# Patient Record
Sex: Female | Born: 1960 | Race: Black or African American | Hispanic: No | Smoking: Never smoker
Health system: Southern US, Community
[De-identification: ages and names within clinical notes are randomized; demographics above are authoritative.]

## PROBLEM LIST (undated history)

## (undated) DIAGNOSIS — M1990 Unspecified osteoarthritis, unspecified site: Secondary | ICD-10-CM

## (undated) DIAGNOSIS — M545 Low back pain, unspecified: Secondary | ICD-10-CM

## (undated) DIAGNOSIS — E785 Hyperlipidemia, unspecified: Secondary | ICD-10-CM

## (undated) DIAGNOSIS — I10 Essential (primary) hypertension: Secondary | ICD-10-CM

## (undated) HISTORY — DX: Essential (primary) hypertension: I10

## (undated) HISTORY — DX: Hyperlipidemia, unspecified: E78.5

## (undated) HISTORY — DX: Low back pain: M54.5

## (undated) HISTORY — DX: Unspecified osteoarthritis, unspecified site: M19.90

## (undated) HISTORY — DX: Low back pain, unspecified: M54.50

---

## 2001-07-16 ENCOUNTER — Other Ambulatory Visit: Admission: RE | Admit: 2001-07-16 | Discharge: 2001-07-16 | Payer: Self-pay | Admitting: Obstetrics and Gynecology

## 2008-01-18 LAB — CONVERTED CEMR LAB: Pap Smear: NORMAL

## 2008-07-25 ENCOUNTER — Encounter: Admission: RE | Admit: 2008-07-25 | Discharge: 2008-07-25 | Payer: Self-pay | Admitting: Emergency Medicine

## 2009-01-09 ENCOUNTER — Emergency Department (HOSPITAL_COMMUNITY): Admission: EM | Admit: 2009-01-09 | Discharge: 2009-01-09 | Payer: Self-pay | Admitting: Emergency Medicine

## 2009-03-09 ENCOUNTER — Ambulatory Visit: Payer: Self-pay | Admitting: Internal Medicine

## 2009-03-09 DIAGNOSIS — I10 Essential (primary) hypertension: Secondary | ICD-10-CM | POA: Insufficient documentation

## 2009-03-09 DIAGNOSIS — M1990 Unspecified osteoarthritis, unspecified site: Secondary | ICD-10-CM | POA: Insufficient documentation

## 2009-03-09 DIAGNOSIS — M129 Arthropathy, unspecified: Secondary | ICD-10-CM | POA: Insufficient documentation

## 2009-03-09 DIAGNOSIS — J45909 Unspecified asthma, uncomplicated: Secondary | ICD-10-CM | POA: Insufficient documentation

## 2009-03-09 DIAGNOSIS — E785 Hyperlipidemia, unspecified: Secondary | ICD-10-CM | POA: Insufficient documentation

## 2009-03-23 ENCOUNTER — Encounter: Payer: Self-pay | Admitting: Internal Medicine

## 2009-03-28 ENCOUNTER — Encounter: Payer: Self-pay | Admitting: Internal Medicine

## 2009-04-07 ENCOUNTER — Ambulatory Visit: Payer: Self-pay | Admitting: Internal Medicine

## 2009-04-07 DIAGNOSIS — E559 Vitamin D deficiency, unspecified: Secondary | ICD-10-CM | POA: Insufficient documentation

## 2009-04-20 ENCOUNTER — Encounter: Payer: Self-pay | Admitting: Internal Medicine

## 2009-05-02 ENCOUNTER — Ambulatory Visit: Payer: Self-pay | Admitting: Gastroenterology

## 2009-05-03 ENCOUNTER — Telehealth: Payer: Self-pay | Admitting: Internal Medicine

## 2009-06-07 ENCOUNTER — Ambulatory Visit: Payer: Self-pay | Admitting: Internal Medicine

## 2010-09-12 ENCOUNTER — Ambulatory Visit: Payer: Self-pay | Admitting: Internal Medicine

## 2010-09-12 DIAGNOSIS — J309 Allergic rhinitis, unspecified: Secondary | ICD-10-CM | POA: Insufficient documentation

## 2010-09-13 ENCOUNTER — Encounter: Payer: Self-pay | Admitting: Internal Medicine

## 2010-09-13 LAB — CONVERTED CEMR LAB
Albumin: 4.6 g/dL
Alkaline Phosphatase: 64 units/L
Basophils Relative: 1 %
CO2: 26 meq/L
Creatinine, Ser: 0.98 mg/dL
Eosinophils Relative: 6 %
HCT: 38.4 %
HDL: 90 mg/dL
LDL (calc): 147 mg/dL
Neutrophils Relative %: 52 %
RBC: 4.53 M/uL
Total Bilirubin: 0.5 mg/dL
Triglyceride fasting, serum: 61 mg/dL

## 2010-09-14 ENCOUNTER — Encounter: Payer: Self-pay | Admitting: Internal Medicine

## 2010-09-28 ENCOUNTER — Telehealth: Payer: Self-pay | Admitting: Internal Medicine

## 2010-10-02 ENCOUNTER — Telehealth (INDEPENDENT_AMBULATORY_CARE_PROVIDER_SITE_OTHER): Payer: Self-pay | Admitting: *Deleted

## 2010-11-14 ENCOUNTER — Encounter: Payer: Self-pay | Admitting: Internal Medicine

## 2010-11-14 ENCOUNTER — Ambulatory Visit: Payer: Self-pay | Admitting: Internal Medicine

## 2010-11-16 ENCOUNTER — Encounter: Payer: Self-pay | Admitting: Internal Medicine

## 2010-12-17 LAB — CONVERTED CEMR LAB
Bilirubin, Direct: 0.11 mg/dL
Pap Smear: NORMAL
TSH: 0.934 microintl units/mL

## 2010-12-19 NOTE — Letter (Signed)
Summary: Lipid Letter  [facility]  [address] [place], [place] [address]   Phone: [phone]  Fax: [phone]    09/14/2010  [facility] [address] [place], [place]  [address]  Dear Gabrielle Long:  We have carefully reviewed your last lipid profile from  and the results are noted below with a summary of recommendations for lipid management.    Cholesterol:       249     Goal: <200   HDL "good" Cholesterol:   90     Goal: >50 very good   LDL "bad" Cholesterol:   147     Goal: <130   Triglycerides:       61     Goal: <150        TLC Diet (Therapeutic Lifestyle Change): Saturated Fats & Transfatty acids should be kept < 7% of total calories ***Reduce Saturated Fats Polyunstaurated Fat can be up to 10% of total calories Monounsaturated Fat Fat can be up to 20% of total calories Total Fat should be no greater than 25-35% of total calories Carbohydrates should be 50-60% of total calories Protein should be approximately 15% of total calories Fiber should be at least 20-30 grams a day ***Increased fiber may help lower LDL Total Cholesterol should be < 200mg /day Consider adding plant stanol/sterols to diet (example: Benacol spread) ***A higher intake of unsaturated fat may reduce Triglycerides and Increase HDL    Adjunctive Measures (may lower LIPIDS and reduce risk of Heart Attack) include: Aerobic Exercise (20-30 minutes 3-4 times a week) Limit Alcohol Consumption Weight Reduction Aspirin 75-81 mg a day by mouth (if not allergic or contraindicated) Dietary Fiber 20-30 grams a day by mouth     Current Medications: 1)    Vitamin D 6000 Unit Tabs (cholecalciferol)  .... Take 1 tablet by mouth once a day 2)    Vitamin E  .... Take 1 tablet by mouth three times a day 3)    Nasacort Aq 55 Mcg/act Aers (Triamcinolone acetonide) .... 2 puffs each nostril once daily  If you have any questions, please call. We appreciate being able to work with you.   Sincerely,      [facility] Etta Grandchild MD

## 2010-12-19 NOTE — Miscellaneous (Signed)
Summary: 09/12/10 [facility] Results  Clinical Lists Changes  Observations: Added new observation of CBC COMMENTS: MCH 29.4, MCHC 34.6, Absolute Neutrophils 2.8, Absolute Monocytes 0.4, Absolute Eos 0.3, Absolute Baso 1, Absolute Lymphs 1.8 (09/13/2010 16:06) Added new observation of TRIGLYCERIDE: 61 mg/dL (53/66/4403 47:42) Added new observation of HDL: 90 mg/dL (59/56/3875 64:33) Added new observation of LDL (CALCUL): 147 mg/dL (29/51/8841 66:06) Added new observation of CHOLESTEROL: 249 mg/dL (30/16/0109 32:35) Added new observation of GFRAA: 79 mL/min (09/13/2010 16:06) Added new observation of GFR: 68 mL/min (09/13/2010 16:06) Added new observation of ALBUMIN: 4.6 g/dL (57/32/2025 42:70) Added new observation of PROTEIN, TOT: 7.3 g/dL (62/37/6283 15:17) Added new observation of CALCIUM: 9.6 mg/dL (61/60/7371 06:26) Added new observation of ALK PHOS: 64 units/L (09/13/2010 16:06) Added new observation of BILI TOTAL: 0.5 mg/dL (94/85/4627 03:50) Added new observation of SGPT (ALT): 19 units/L (09/13/2010 16:06) Added new observation of SGOT (AST): 18 units/L (09/13/2010 16:06) Added new observation of CO2 PLSM/SER: 26 meq/L (09/13/2010 16:06) Added new observation of CL SERUM: 102 meq/L (09/13/2010 16:06) Added new observation of K SERUM: 3.8 meq/L (09/13/2010 16:06) Added new observation of NA: 142 meq/L (09/13/2010 16:06) Added new observation of CREATININE: 0.98 mg/dL (09/38/1829 93:71) Added new observation of BUN: 13 mg/dL (69/67/8938 10:17) Added new observation of BG RANDOM: 83 mg/dL (51/12/5850 77:82) Added new observation of BASOPHIL %: 1 % (09/13/2010 16:06) Added new observation of % EOS AUTO: 6 % (09/13/2010 16:06) Added new observation of MONOCYTE %: 7 % (09/13/2010 16:06) Added new observation of LYMPH %: 34 % (09/13/2010 16:06) Added new observation of PMN %: 52 % (09/13/2010 16:06) Added new observation of RDW: 13.2 % (09/13/2010 16:06) Added new observation of MCV: 85  fL (09/13/2010 16:06) Added new observation of PLATELETK/UL: 297 K/uL (09/13/2010 16:06) Added new observation of RBC M/UL: 4.53 M/uL (09/13/2010 16:06) Added new observation of HCT: 38.4 % (09/13/2010 16:06) Added new observation of WBC COUNT: 5.4 10*3/microliter (09/13/2010 16:06) Added new observation of VIT D 25-OH: 46.8 ng/mL (09/12/2010 16:35) Added new observation of BILI DIRECT: 0.11 mg/dL (42/35/3614 43:15) Added new observation of TSH: 0.934 microintl units/mL (09/12/2010 16:33) Added new observation of LIPIDMEDPRES: VLDL Cal 12,  (09/12/2010 16:31)      -  Date:  09/13/2010    WBC: 5.4    HCT: 38.4    RBC: 4.53    PLT: 297    MCV: 85    RDW: 13.2    Neutrophil: 52    Lymphs: 34    Monos: 7    Eos: 6    Basophil: 1    BG Random: 83    BUN: 13    Creatinine: 0.98    Sodium: 142    Potassium: 3.8    Chloride: 102    CO2 Total: 26    SGOT (AST): 18    SGPT (ALT): 19    T. Bilirubin: 0.5    Alk Phos: 64    Calcium: 9.6    Total Protein: 7.3    Albumin: 4.6    GFR(Non African American): 68    GFR(African American) 79    Cholesterol: 249    LDL-calculated: 147    HDL: 90    Triglycerides: 61    CBC Comments: MCH 29.4, MCHC 34.6, Absolute Neutrophils 2.8, Absolute Monocytes 0.4, Absolute Eos 0.3, Absolute Baso 1, Absolute Lymphs 1.8  Date:  09/12/2010    Current Lipid Medications VLDL Cal 12,    TSH: 0.934    Direct Bili: 0.11  Vit D 25: 46.8

## 2010-12-19 NOTE — Letter (Signed)
Summary: Results Follow-up Letter  [facility]  [address] [place], [place] [address]   Phone: [phone]  Fax: [phone]    09/14/2010  [address] [place], [place]  [address]  Dear Ms. Cadman,   The following are the results of your recent test(s):  Test     Result     Vitamin D     normal CBC       normal Liver/kidney   normal Thyroid     normal   _________________________________________________________  Please call for an appointment as needed _________________________________________________________ _________________________________________________________ _________________________________________________________  Sincerely,  Sanda Linger MD [facility]

## 2010-12-19 NOTE — Progress Notes (Signed)
Summary: PA-Benicar  Phone Note From Pharmacy   Summary of Call: PA-Benicar, called [facility] @ [phone], awaiting form. Dagoberto Reef  October 02, 2010 4:31 PM Form to Dr Yetta Barre to be filled out . Dagoberto Reef  October 03, 2010 3:42 PM  Faxed to [facility] @ [phone], awaiting approval. Dagoberto Reef  October 04, 2010 8:18 AM  Insurance denied, has pt tried one Ace inhibitor or an Ace inhibitor combination product or generic losartan or generic losartan combination product. Dagoberto Reef  October 04, 2010 3:26 PM     New/Updated Medications: LOSARTAN POTASSIUM 50 MG TABS (LOSARTAN POTASSIUM) One by mouth once daily for high blood pressure Prescriptions: LOSARTAN POTASSIUM 50 MG TABS (LOSARTAN POTASSIUM) One by mouth once daily for high blood pressure  #30 x 11   Entered and Authorized by:   Etta Grandchild MD   Signed by:   Etta Grandchild MD on 10/04/2010   Method used:   Electronically to        [id number] (retail)       [address]       [place], [place]  [address]       Ph: [phone] or [phone]       Fax: [phone]   RxID:   [id number]

## 2010-12-19 NOTE — Progress Notes (Signed)
Summary: ELEVATED Bp   Phone Note Call from Patient Call back at [phone]   Summary of Call: Pt c/o elevated bp 145/107 today. Does pt need office visit or would MD start her back on BP med?  Initial call taken by: Lamar Sprinkles, CMA,  September 28, 2010 11:43 AM  Follow-up for Phone Call        can restart benicar Follow-up by: Etta Grandchild MD,  September 28, 2010 11:44 AM  Additional Follow-up for Phone Call Additional follow up Details #1::        Pt informed, She will continue to monitor BP and call office w/any further concerns.  Additional Follow-up by: Lamar Sprinkles, CMA,  September 28, 2010 2:07 PM    New/Updated Medications: BENICAR 40 MG TABS (OLMESARTAN MEDOXOMIL) One by mouth once daily for high blood pressure Prescriptions: BENICAR 40 MG TABS (OLMESARTAN MEDOXOMIL) One by mouth once daily for high blood pressure  #30 x 11   Entered and Authorized by:   Etta Grandchild MD   Signed by:   Etta Grandchild MD on 09/28/2010   Method used:   Electronically to        [facility] (retail)       [address]       [place], [place]  [address]       Ph: [phone]       Fax: [phone]   RxID:   [id number]

## 2010-12-19 NOTE — Assessment & Plan Note (Signed)
Summary: CPX / NWS /#   Vital Signs:  Patient profile:   50 year old female Menstrual status:  perimenopausal LMP:     08/22/2010 Height:      65 inches Weight:      149 pounds BMI:     24.88 O2 Sat:      97 % on Room air Temp:     98.2 degrees F oral Pulse rate:   83 / minute Pulse rhythm:   regular Resp:     16 per minute BP sitting:   126 / 82  (left arm) Cuff size:   large  Vitals Entered By: Rock Nephew CMA (September 12, 2010 9:10 AM)  O2 Flow:  Room air CC: CPX w lab order , Preventive Care, URI symptoms, Hypertension Management Is Patient Diabetic? No Pain Assessment Patient in pain? no       Does patient need assistance? Functional Status Self care Ambulation Normal LMP (date): 08/22/2010     Menstrual Status perimenopausal Enter LMP: 08/22/2010 Last PAP Result normal   Primary Care Provider:  Etta Grandchild MD  CC:  CPX w lab order , Preventive Care, URI symptoms, and Hypertension Management.  History of Present Illness:  URI Symptoms      This is a 50 year old woman who presents with URI symptoms.  The symptoms began 3 days ago.  The patient reports nasal congestion and clear nasal discharge, but denies purulent nasal discharge, sore throat, dry cough, productive cough, earache, and sick contacts.  The patient denies fever, stiff neck, dyspnea, wheezing, rash, vomiting, diarrhea, use of an antipyretic, and response to antipyretic.  The patient also reports itchy throat, sneezing, seasonal symptoms, and response to antihistamine.  The patient denies headache, muscle aches, and severe fatigue.  The patient denies the following risk factors for Strep sinusitis: unilateral facial pain, unilateral nasal discharge, poor response to decongestant, double sickening, tooth pain, Strep exposure, tender adenopathy, and absence of cough.    Hypertension History:      She denies headache, chest pain, palpitations, dyspnea with exertion, orthopnea, PND, peripheral  edema, visual symptoms, neurologic problems, syncope, and side effects from treatment.        Positive major cardiovascular risk factors include hyperlipidemia and hypertension.  Negative major cardiovascular risk factors include female age less than 89 years old, no history of diabetes, negative family history for ischemic heart disease, and non-tobacco-user status.        Further assessment for target organ damage reveals no history of ASHD, cardiac end-organ damage (CHF/LVH), stroke/TIA, peripheral vascular disease, renal insufficiency, or hypertensive retinopathy.     Preventive Screening-Counseling & Management  Alcohol-Tobacco     Alcohol drinks/day: 0     Alcohol Counseling: not indicated; patient does not drink     Smoking Status: never     Tobacco Counseling: not indicated; no tobacco use  Hep-HIV-STD-Contraception     Hepatitis Risk: no risk noted     HIV Risk: no risk noted     STD Risk: no risk noted     Dental Visit-last 6 months yes     Dental Care Counseling: to seek dental care; no dental care within six months     SBE monthly: yes     SBE Education/Counseling: to perform regular SBE     Sun Exposure-Excessive: no  Safety-Violence-Falls     Seat Belt Use: yes     Helmet Use: n/a     Firearms in the Home: no firearms  in the home     Smoke Detectors: yes     Violence in the Home: no risk noted     Sexual Abuse: no      Sexual History:  currently monogamous.        Drug Use:  no.        Blood Transfusions:  no.    Medications Prior to Update: 1)  Vitamin D 6000 Unit Tabs (Cholecalciferol)  Current Medications (verified): 1)  Vitamin D 6000 Unit Tabs (Cholecalciferol) .... Take 1 Tablet By Mouth Once A Day 2)  Vitamin E .... Take 1 Tablet By Mouth Three Times A Day 3)  Nasacort Aq 55 Mcg/act Aers (Triamcinolone Acetonide) .... 2 Puffs Each Nostril Once Daily  Allergies (verified): No Known Drug Allergies  Past History:  Past Medical History: Last  updated: 05/02/2009 Asthma Hyperlipidemia Hypertension Low back pain  Degenerative joint disease  Past Surgical History: Last updated: 03/09/2009 Denies surgical history  Family History: Last updated: 05/02/2009 Family History of Colon CA: MGM at 54. 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 Family History of Colon CA: MGM at 78. Family History Hypertension (parent & grandparents) Family History Kidney disease (Grandparents) Family History Lung cancer (grandparents)  Social History: Reviewed history from 03/09/2009 and no changes required. 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    Physical Exam  General:  Well developed, well nourished, no acute distress. Head:  normocephalic, atraumatic, no abnormalities observed, and no abnormalities palpated.   Ears:  R ear normal and L ear normal.   Nose:  no nasal discharge, mucosal pallor and mucosal edema. no airflow obstruction, no nasal polyps, no nasal mucosal lesions, no mucosal  friability, no active bleeding or clots, no sinus percussion tenderness, no septum abnormalities.   Mouth:  Oral mucosa and oropharynx without lesions or exudates.  Teeth in good repair. Neck:  supple, full ROM, no masses, no thyromegaly, no JVD, normal carotid upstroke, no carotid bruits, no cervical lymphadenopathy, and no neck tenderness.   Lungs:  normal respiratory effort, no intercostal retractions, no accessory muscle use, normal breath sounds, no dullness, no fremitus, no crackles, and no wheezes.   Heart:  normal rate, regular rhythm, no murmur, no gallop, no rub, and no JVD.   Abdomen:  soft, non-tender, normal bowel sounds, no distention, no masses, no guarding, no rigidity, no rebound tenderness, no abdominal hernia, no inguinal hernia, no hepatomegaly, and no splenomegaly.   Msk:  No deformity or scoliosis noted of thoracic or lumbar spine.   Pulses:  R and L carotid,radial,femoral,dorsalis pedis and posterior tibial pulses are full and equal bilaterally Extremities:  No clubbing, cyanosis, edema, or deformity noted with normal full range of motion of all joints.   Neurologic:  No cranial nerve deficits  noted. Station and gait are normal. Plantar reflexes are down-going bilaterally. DTRs are symmetrical throughout. Sensory, motor and coordinative functions appear intact. Skin:  turgor normal, color normal, no rashes, no suspicious lesions, no ecchymoses, no petechiae, no purpura, no ulcerations, and no edema.   Cervical Nodes:  no anterior cervical adenopathy and no posterior cervical adenopathy.   Axillary Nodes:  no R axillary adenopathy and no L axillary adenopathy.   Inguinal Nodes:  no R inguinal adenopathy and no L inguinal adenopathy.   Psych:  Cognition and judgment appear intact. Alert and cooperative with normal attention span and concentration. 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) Assessment New start nasacort  Problem # 2:  VITAMIN D DEFICIENCY (ICD-268.9) Assessment: Unchanged  Orders: Venipuncture (93235) TLB-Lipid Panel (80061-LIPID) TLB-BMP (Basic Metabolic Panel-BMET) (80048-METABOL) TLB-CBC Platelet - w/Differential (85025-CBCD) TLB-Hepatic/Liver Function Pnl (80076-HEPATIC) TLB-TSH (Thyroid Stimulating Hormone) (84443-TSH) T-Vitamin D (25-Hydroxy) (57322-02542)  Problem # 3:  HYPERTENSION (ICD-401.9) Assessment: Improved  Orders: Venipuncture (70623) TLB-Lipid Panel (80061-LIPID) TLB-BMP (Basic Metabolic Panel-BMET) (80048-METABOL) TLB-CBC Platelet - w/Differential (85025-CBCD) TLB-Hepatic/Liver Function Pnl (80076-HEPATIC) TLB-TSH (Thyroid Stimulating Hormone) (84443-TSH) T-Vitamin D (25-Hydroxy) (76283-15176)  BP today: 126/82 Prior BP: 120/82 (06/07/2009)  Prior 10 Yr Risk Heart Disease: Not enough information (03/09/2009)  Problem # 4:  HYPERLIPIDEMIA (ICD-272.4) Assessment: Unchanged  Orders: Venipuncture ([address]) TLB-Lipid Panel (80061-LIPID) TLB-BMP (Basic Metabolic Panel-BMET) (80048-METABOL) TLB-CBC Platelet - w/Differential (85025-CBCD) TLB-Hepatic/Liver Function Pnl (80076-HEPATIC) TLB-TSH (Thyroid Stimulating Hormone) (84443-TSH) T-Vitamin D (25-Hydroxy) (71062-69485)  Prior 10 Yr Risk Heart Disease: Not enough information (03/09/2009)  Complete Medication List: 1)  Vitamin D 6000 Unit Tabs (cholecalciferol)  .... Take 1 tablet by mouth once a day 2)  Vitamin E  .... Take 1 tablet by mouth three times a day 3)  Nasacort Aq 55 Mcg/act Aers (Triamcinolone acetonide) .... 2 puffs each nostril once daily  Hypertension Assessment/Plan:      The patient's hypertensive risk group is category B: At least one risk factor (excluding diabetes) with no target organ damage.  Today's blood pressure is 126/82.  Her blood pressure goal is < 140/90.  PAP Screening:    Hx Cervical Dysplasia in last 5 yrs? No    3  normal PAP smears in last 5 yrs? Yes    Last PAP smear:  11/19/2008    Reviewed PAP smear recommendations:  patient defers to GYN provider  Mammogram Screening:    Last Mammogram:  11/19/2008    Reviewed Mammogram recommendations:  mammogram ordered  Osteoporosis Risk Assessment:  Risk Factors for Fracture or Low Bone Density:   Smoking status:       never  Immunization & Chemoprophylaxis:    Tetanus vaccine: Td  (11/19/2006)  Patient Instructions: 1)  Please schedule a follow-up appointment in 2 months. 2)  It is important that you exercise regularly at least 20 minutes 5 times a week. If you develop chest pain, have severe difficulty breathing, or feel very tired , stop exercising immediately and seek medical attention. 3)  Check your Blood Pressure regularly. If it is above 140/90: you should make an appointment. Prescriptions: NASACORT AQ 55 MCG/ACT AERS (TRIAMCINOLONE ACETONIDE) 2 puffs each nostril once daily  #1 inhaler x 11   Entered and Authorized by:   Etta Grandchild MD   Signed by:   Etta Grandchild MD on 09/12/2010   Method used:   Electronically to  [facility]       [address]       [place], [place]  [address]       Ph: [phone]       Fax: [phone]   RxID:   [id number]    Orders Added: 1)  Venipuncture [84132] 2)  TLB-Lipid Panel [80061-LIPID] 3)  TLB-BMP (Basic Metabolic Panel-BMET) [80048-METABOL] 4)  TLB-CBC Platelet - w/Differential [85025-CBCD] 5)  TLB-Hepatic/Liver Function Pnl [80076-HEPATIC] 6)  TLB-TSH (Thyroid Stimulating Hormone) [84443-TSH] 7)  T-Vitamin D (25-Hydroxy) [44010-27253] 8)  Est. Patient Level IV [66440]    Preventive Care Screening  Mammogram:    Date:  11/19/2008    Results:  normal   Pap Smear:    Date:  11/19/2008    Results:  normal

## 2010-12-21 NOTE — Letter (Signed)
Summary: Lipid Letter  [facility]  [address] [place], [place] [address]   Phone: [phone]  Fax: [phone]    11/16/2010  [facility] [address] [place], [place]  [address]  Dear Gabrielle Long:  We have carefully reviewed your last lipid profile from  and the results are noted below with a summary of recommendations for lipid management.    Cholesterol:       242     Goal: <200   HDL "good" Cholesterol:   90     Goal: >50 excellent!   LDL "bad" Cholesterol:   143     Goal: <130   Triglycerides:       43     Goal: <150        TLC Diet (Therapeutic Lifestyle Change): Saturated Fats & Transfatty acids should be kept < 7% of total calories ***Reduce Saturated Fats Polyunstaurated Fat can be up to 10% of total calories Monounsaturated Fat Fat can be up to 20% of total calories Total Fat should be no greater than 25-35% of total calories Carbohydrates should be 50-60% of total calories Protein should be approximately 15% of total calories Fiber should be at least 20-30 grams a day ***Increased fiber may help lower LDL Total Cholesterol should be < 200mg /day Consider adding plant stanol/sterols to diet (example: Benacol spread) ***A higher intake of unsaturated fat may reduce Triglycerides and Increase HDL    Adjunctive Measures (may lower LIPIDS and reduce risk of Heart Attack) include: Aerobic Exercise (20-30 minutes 3-4 times a week) Limit Alcohol Consumption Weight Reduction Aspirin 75-81 mg a day by mouth (if not allergic or contraindicated) Dietary Fiber 20-30 grams a day by mouth     Current Medications: 1)    Vitamin D 6000 Unit Tabs (cholecalciferol)  .... Take 1 tablet by mouth once a day 2)    Vitamin E  .... Take 1 tablet by mouth three times a day 3)    Nasacort Aq 55 Mcg/act Aers (Triamcinolone acetonide) .... 2 puffs each nostril once daily 4)    Diovan Hct 160-12.5 Mg Tabs (Valsartan-hydrochlorothiazide) .... One by mouth once daily for  high blood pressure  If you have any questions, please call. We appreciate being able to work with you.   Sincerely,    [facility] Etta Grandchild MD

## 2010-12-21 NOTE — Assessment & Plan Note (Signed)
Summary: 2 MO ROV /NWS  #   Vital Signs:  Patient profile:   50 year old female Menstrual status:  perimenopausal Height:      65 inches Weight:      148 pounds BMI:     24.72 O2 Sat:      99 % on Room air Temp:     98.5 degrees F oral Pulse rate:   85 / minute Pulse rhythm:   regular Resp:     16 per minute BP supine:   144 / 98  (right arm) BP sitting:   138 / 98  (left arm) Cuff size:   regular  Vitals Entered By: Bill Salinas CMA (November 14, 2010 8:14 AM)  O2 Flow:  Room air CC: follow-up visit/ ab   Primary Care Provider:  Etta Grandchild MD  CC:  follow-up visit/ ab.  History of Present Illness:  Hypertension Follow-Up      This is a 50 year old woman who presents for Hypertension follow-up.  The patient reports headaches, but denies lightheadedness, urinary frequency, edema, rash, and fatigue.  The patient denies the following associated symptoms: chest pain, chest pressure, exercise intolerance, dyspnea, palpitations, syncope, leg edema, and pedal edema.  Compliance with medications (by patient report) has been near 100%.  The patient reports that dietary compliance has been fair.  The patient reports no exercise.  Adjunctive measures currently used by the patient include salt restriction and relaxation.    Preventive Screening-Counseling & Management  Alcohol-Tobacco     Alcohol drinks/day: 0     Alcohol Counseling: not indicated; patient does not drink     Smoking Status: never     Tobacco Counseling: not indicated; no tobacco use  Hep-HIV-STD-Contraception     Hepatitis Risk: no risk noted     HIV Risk: no risk noted     STD Risk: no risk noted     Dental Visit-last 6 months yes     Dental Care Counseling: to seek dental care; no dental care within six months     SBE monthly: yes     SBE Education/Counseling: to perform regular SBE     Sun Exposure-Excessive: no      Sexual History:  currently monogamous.        Drug Use:  no.        Blood  Transfusions:  no.    Clinical Review Panels:  Prevention   Last Mammogram:  normal (11/19/2008)   Last Pap Smear:  normal (11/19/2008)  Immunizations   Last Tetanus Booster:  Td (11/19/2006)  Lipid Management   Cholesterol:  249 (09/13/2010)   HDL (good cholesterol):  90 (09/13/2010)   Triglycerides:  61 (09/13/2010)  Diabetes Management   Creatinine:  0.98 (09/13/2010)  CBC   WBC:  5.4 (09/13/2010)   RBC:  4.53 (09/13/2010)   Hct:  38.4 (09/13/2010)   Platelets:  297 (09/13/2010)   MCV  85 (09/13/2010)   RDW  13.2 (09/13/2010)   PMN:  52 (09/13/2010)   Monos:  7 (09/13/2010)   Eosinophils:  6 (09/13/2010)   Basophil:  1 (09/13/2010)  Complete Metabolic Panel   Glucose:  83 (09/13/2010)   Sodium:  142 (09/13/2010)   Potassium:  3.8 (09/13/2010)   Chloride:  102 (09/13/2010)   CO2:  26 (09/13/2010)   BUN:  13 (09/13/2010)   Creatinine:  0.98 (09/13/2010)   Albumin:  4.6 (09/13/2010)   Total Protein:  7.3 (09/13/2010)   Calcium:  9.6 (  09/13/2010)   Total Bili:  0.5 (09/13/2010)   Alk Phos:  64 (09/13/2010)   SGPT (ALT):  19 (09/13/2010)   SGOT (AST):  18 (09/13/2010)   Medications Prior to Update: 1)  Vitamin D 6000 Unit Tabs (Cholecalciferol) .... Take 1 Tablet By Mouth Once A Day 2)  Vitamin E .... Take 1 Tablet By Mouth Three Times A Day 3)  Nasacort Aq 55 Mcg/act Aers (Triamcinolone Acetonide) .... 2 Puffs Each Nostril Once Daily 4)  Losartan Potassium 50 Mg Tabs (Losartan Potassium) .... One By Mouth Once Daily For High Blood Pressure  Current Medications (verified): 1)  Vitamin D 6000 Unit Tabs (Cholecalciferol) .... Take 1 Tablet By Mouth Once A Day 2)  Vitamin E .... Take 1 Tablet By Mouth Three Times A Day 3)  Nasacort Aq 55 Mcg/act Aers (Triamcinolone Acetonide) .... 2 Puffs Each Nostril Once Daily 4)  Diovan Hct 160-12.5 Mg Tabs (Valsartan-Hydrochlorothiazide) .... One By Mouth Once Daily For High Blood Pressure  Allergies (verified): No Known  Drug Allergies  Past History:  Past Medical History: Last updated: 05/02/2009 Asthma Hyperlipidemia Hypertension Low back pain  Degenerative joint disease  Past Surgical History: Last updated: 03/09/2009 Denies surgical history  Family History: Last updated: 05/02/2009 Family History of Colon CA: MGM at 53. 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 Family History of Colon CA: MGM at 67. Family History Hypertension (parent & grandparents) Family History Kidney disease (Grandparents) Family History Lung cancer (grandparents)  Social History: Reviewed history from 03/09/2009 and no changes required. 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  The patient denies anorexia, fever, weight loss, weight gain, chest pain, syncope, dyspnea on exertion, peripheral edema, prolonged cough, hemoptysis, abdominal pain, muscle weakness, suspicious skin lesions, transient blindness, difficulty walking, and depression.    Physical Exam  General:  Well developed, well nourished, no acute distress. Head:  normocephalic, atraumatic, no abnormalities observed, and no abnormalities palpated.   Mouth:  Oral mucosa and oropharynx without lesions or exudates.  Teeth in good repair. Neck:  supple, full ROM, no masses, no thyromegaly, no JVD, normal carotid upstroke, no carotid bruits, no cervical lymphadenopathy, and no  neck tenderness.   Lungs:  normal respiratory effort, no intercostal retractions, no accessory muscle use, normal breath sounds, no dullness, no fremitus, no crackles, and no wheezes.   Heart:  normal rate, regular rhythm, no murmur, no gallop, no rub, and no JVD.   Abdomen:  soft, non-tender, normal bowel sounds, no distention, no masses, no guarding, no rigidity, no rebound tenderness, no abdominal hernia, no inguinal hernia, no hepatomegaly, and no splenomegaly.   Msk:  No deformity or scoliosis noted of thoracic or lumbar spine.   Pulses:  R and L carotid,radial,femoral,dorsalis pedis and posterior tibial pulses are full and equal bilaterally Extremities:  No clubbing, cyanosis, edema, or deformity noted with normal full range of motion of all joints.   Neurologic:  No cranial nerve deficits noted. Station and gait are normal. Plantar reflexes are down-going bilaterally. DTRs are symmetrical throughout. Sensory,  motor and coordinative functions appear intact. Skin:  turgor normal, color normal, no rashes, no suspicious lesions, no ecchymoses, no petechiae, no purpura, no ulcerations, and no edema.   Cervical Nodes:  no anterior cervical adenopathy and no posterior cervical adenopathy.   Psych:  Cognition and judgment appear intact. Alert and cooperative with normal attention span and concentration. No apparent delusions, illusions, hallucinations   Impression & Recommendations:  Problem # 1:  VITAMIN D DEFICIENCY (ICD-268.9) Assessment Improved  Problem # 2:  HYPERTENSION (ICD-401.9) Assessment: Deteriorated  The following medications were removed from the medication list:    Losartan Potassium 50 Mg Tabs (Losartan potassium) ..... One by mouth once daily for high blood pressure Her updated medication list for this problem includes:    Diovan Hct 160-12.5 Mg Tabs (Valsartan-hydrochlorothiazide) ..... One by mouth once daily for high blood pressure  Orders: TLB-BMP (Basic Metabolic  Panel-BMET) (80048-METABOL)  BP today: 138/98 Prior BP: 126/82 (09/12/2010)  Prior 10 Yr Risk Heart Disease: Not enough information (03/09/2009)  Labs Reviewed: K+: 3.8 (09/13/2010) Creat: : 0.98 (09/13/2010)   Chol: 249 (09/13/2010)   HDL: 90 (09/13/2010)   LDL: 147 (09/13/2010)   TG: 61 (09/13/2010)  Problem # 3:  HYPERLIPIDEMIA (ICD-272.4) Assessment: Unchanged  Orders: TLB-Lipid Panel (80061-LIPID)  Labs Reviewed: SGOT: 18 (09/13/2010)   SGPT: 19 (09/13/2010)  Prior 10 Yr Risk Heart Disease: Not enough information (03/09/2009)   HDL:90 (09/13/2010)  LDL:147 (09/13/2010)  Chol:249 (09/13/2010)  Trig:61 (09/13/2010)  Complete Medication List: 1)  Vitamin D 6000 Unit Tabs (cholecalciferol)  .... Take 1 tablet by mouth once a day 2)  Vitamin E  .... Take 1 tablet by mouth three times a day 3)  Nasacort Aq 55 Mcg/act Aers (Triamcinolone acetonide) .... 2 puffs each nostril once daily 4)  Diovan Hct 160-12.5 Mg Tabs (Valsartan-hydrochlorothiazide) .... One by mouth once daily for high blood pressure  Patient Instructions: 1)  Please schedule a follow-up appointment in 2 months. 2)  It is important that you exercise regularly at least 20 minutes 5 times a week. If you develop chest pain, have severe difficulty breathing, or feel very tired , stop exercising immediately and seek medical attention. 3)  Check your Blood Pressure regularly. If it is above 130/80: you should make an appointment. Prescriptions: DIOVAN HCT 160-12.5 MG TABS (VALSARTAN-HYDROCHLOROTHIAZIDE) One by mouth once daily for high blood pressure  #140 x 0   Entered and Authorized by:   Etta Grandchild MD   Signed by:   Etta Grandchild MD on 11/14/2010   Method used:   Samples Given   RxID:   [id number]    Orders Added: 1)  TLB-Lipid Panel [80061-LIPID] 2)  TLB-BMP (Basic Metabolic Panel-BMET) [80048-METABOL] 3)  Est. Patient Level IV [42595]

## 2011-01-16 ENCOUNTER — Ambulatory Visit: Payer: Self-pay | Admitting: Internal Medicine

## 2011-01-23 ENCOUNTER — Ambulatory Visit (INDEPENDENT_AMBULATORY_CARE_PROVIDER_SITE_OTHER): Payer: 59 | Admitting: Internal Medicine

## 2011-01-23 ENCOUNTER — Encounter: Payer: Self-pay | Admitting: Internal Medicine

## 2011-01-23 DIAGNOSIS — I10 Essential (primary) hypertension: Secondary | ICD-10-CM

## 2011-01-23 DIAGNOSIS — E785 Hyperlipidemia, unspecified: Secondary | ICD-10-CM

## 2011-01-30 LAB — HM PAP SMEAR: HM Pap smear: NORMAL

## 2011-01-30 NOTE — Assessment & Plan Note (Signed)
Summary: 2 MO FU /NWS   Vital Signs:  Patient profile:   50 year old female Menstrual status:  perimenopausal Height:      65 inches (165.10 cm) Weight:      148 pounds (67.27 kg) BMI:     24.72 O2 Sat:      98 % on Room air Temp:     98.5 degrees F (36.94 degrees C) oral Pulse rate:   95 / minute Pulse rhythm:   regular Resp:     14 per minute BP sitting:   116 / 82  (left arm) Cuff size:   regular  Vitals Entered By: Burnard Leigh CMA(AAMA) (January 23, 2011 8:14 AM)  O2 Flow:  Room air CC: F/U..sls,cma Is Patient Diabetic? No Pain Assessment Patient in pain? no        Primary Care Provider:  Etta Grandchild MD  CC:  F/U..sls and cma.  History of Present Illness:  Follow-Up Visit      This is a 50 year old woman who presents for Follow-up visit.  The patient denies chest pain, palpitations, dizziness, syncope, edema, SOB, DOE, PND, and orthopnea.  Since the last visit the patient notes no new problems or concerns.  The patient reports taking meds as prescribed, monitoring BP, and dietary compliance.  When questioned about possible medication side effects, the patient notes none.    Preventive Screening-Counseling & Management  Alcohol-Tobacco     Alcohol drinks/day: 0     Alcohol Counseling: not indicated; patient does not drink     Smoking Status: never     Tobacco Counseling: not indicated; no tobacco use  Hep-HIV-STD-Contraception     Hepatitis Risk: no risk noted     HIV Risk: no risk noted     STD Risk: no risk noted     Dental Visit-last 6 months yes     Dental Care Counseling: to seek dental care; no dental care within six months     SBE monthly: yes     SBE Education/Counseling: to perform regular SBE     Sun Exposure-Excessive: no  Clinical Review Panels:  Prevention   Last Mammogram:  normal (11/19/2008)   Last Pap Smear:  normal (11/19/2008)  Immunizations   Last Tetanus Booster:  Td (11/19/2006)  Lipid Management   Cholesterol:   249 (09/13/2010)   HDL (good cholesterol):  90 (09/13/2010)   Triglycerides:  61 (09/13/2010)  Diabetes Management   Creatinine:  0.98 (09/13/2010)  CBC   WBC:  5.4 (09/13/2010)   RBC:  4.53 (09/13/2010)   Hct:  38.4 (09/13/2010)   Platelets:  297 (09/13/2010)   MCV  85 (09/13/2010)   RDW  13.2 (09/13/2010)   PMN:  52 (09/13/2010)   Monos:  7 (09/13/2010)   Eosinophils:  6 (09/13/2010)   Basophil:  1 (09/13/2010)  Complete Metabolic Panel   Glucose:  83 (09/13/2010)   Sodium:  142 (09/13/2010)   Potassium:  3.8 (09/13/2010)   Chloride:  102 (09/13/2010)   CO2:  26 (09/13/2010)   BUN:  13 (09/13/2010)   Creatinine:  0.98 (09/13/2010)   Albumin:  4.6 (09/13/2010)   Total Protein:  7.3 (09/13/2010)   Calcium:  9.6 (09/13/2010)   Total Bili:  0.5 (09/13/2010)   Alk Phos:  64 (09/13/2010)   SGPT (ALT):  19 (09/13/2010)   SGOT (AST):  18 (09/13/2010)   Medications Prior to Update: 1)  Vitamin D 6000 Unit Tabs (Cholecalciferol) .... Take 1  Tablet By Mouth Once A Day 2)  Vitamin E .... Take 1 Tablet By Mouth Three Times A Day 3)  Nasacort Aq 55 Mcg/act Aers (Triamcinolone Acetonide) .... 2 Puffs Each Nostril Once Daily 4)  Diovan Hct 160-12.5 Mg Tabs (Valsartan-Hydrochlorothiazide) .... One By Mouth Once Daily For High Blood Pressure  Current Medications (verified): 1)  Vitamin D 6000 Unit Tabs (Cholecalciferol) .... Take 1 Tablet By Mouth Once A Day 2)  Vitamin E .... Take 1 Tablet By Mouth Three Times A Day 3)  Nasacort Aq 55 Mcg/act Aers (Triamcinolone Acetonide) .... 2 Puffs Each Nostril Once Daily 4)  Lipotropic Adjunct (Supplement For Cholesterol) .... Once Daily 5)  Losartan Potassium-Hctz 50-12.5 Mg Tabs (Losartan Potassium-Hctz) .... One By Mouth Once Daily For High Blood Pressure  Allergies (verified): No Known Drug Allergies  Past History:  Past Medical History: Last updated: 05/02/2009 Asthma Hyperlipidemia Hypertension Low back pain  Degenerative joint  disease  Past Surgical History: Last updated: 03/09/2009 Denies surgical history  Family History: Last updated: 05/02/2009 Family History of Colon CA: MGM at 28. 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 Family History of Colon CA: MGM at 51. Family History Hypertension (parent & grandparents) Family History Kidney disease (Grandparents) Family History Lung cancer (grandparents)  Social History: Reviewed history from 03/09/2009 and no changes required. 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    Physical Exam  General:  Well developed, well nourished, no acute distress. Head:  normocephalic, atraumatic, no abnormalities observed, and no abnormalities palpated.   Mouth:  Oral mucosa and oropharynx without lesions or exudates.  Teeth in good repair. Neck:  supple, full ROM, no masses, no thyromegaly, no JVD, normal carotid upstroke, no carotid bruits, no cervical lymphadenopathy, and no neck tenderness.   Lungs:  normal respiratory effort, no intercostal retractions, no accessory muscle use, normal breath sounds, no dullness,  no fremitus, no crackles, and no wheezes.   Heart:  normal rate, regular rhythm, no murmur, no gallop, no rub, and no JVD.   Abdomen:  soft, non-tender, normal bowel sounds, no distention, no masses, no guarding, no rigidity, no rebound tenderness, no abdominal hernia, no inguinal hernia, no hepatomegaly, and no splenomegaly.   Msk:  No deformity or scoliosis noted of thoracic or lumbar spine.   Pulses:  R and L carotid,radial,femoral,dorsalis pedis and posterior tibial pulses are full and equal bilaterally Extremities:  No clubbing, cyanosis, edema, or deformity noted with normal full range of motion of all joints.   Neurologic:  No cranial nerve deficits noted. Station and gait are normal. Plantar reflexes are down-going bilaterally. DTRs are symmetrical throughout. Sensory, motor and coordinative functions appear intact. Skin:  turgor normal, color normal, no rashes, no suspicious lesions, no ecchymoses, no petechiae, no purpura, no ulcerations, and no edema.   Cervical Nodes:  no anterior cervical adenopathy and no posterior  cervical adenopathy.   Psych:  Cognition and judgment appear intact. Alert and cooperative with normal attention span and concentration. No apparent delusions, illusions, hallucinations   Impression & Recommendations:  Problem # 1:  HYPERTENSION (ICD-401.9) Assessment Improved  The following medications were removed from the medication list:    Diovan Hct 160-12.5 Mg Tabs (Valsartan-hydrochlorothiazide) ..... One by mouth once daily for high blood pressure Her updated medication list for this problem includes:    Losartan Potassium-hctz 50-12.5 Mg Tabs (Losartan potassium-hctz) ..... One by mouth once daily for high blood pressure  Orders: Venipuncture (03474) TLB-BMP (Basic Metabolic Panel-BMET) (80048-METABOL)  BP today: 116/82 Prior BP: 144/98 (11/14/2010)  Prior 10 Yr Risk Heart Disease: Not enough information (03/09/2009)  Labs Reviewed: K+: 3.8  (09/13/2010) Creat: : 0.98 (09/13/2010)   Chol: 249 (09/13/2010)   HDL: 90 (09/13/2010)   LDL: 147 (09/13/2010)   TG: 61 (09/13/2010)  Problem # 2:  HYPERLIPIDEMIA (ICD-272.4) Assessment: Unchanged  Orders: Venipuncture (25956) TLB-Hepatic/Liver Function Pnl (80076-HEPATIC) TLB-Lipid Panel (80061-LIPID)  Labs Reviewed: SGOT: 18 (09/13/2010)   SGPT: 19 (09/13/2010)  Prior 10 Yr Risk Heart Disease: Not enough information (03/09/2009)   HDL:90 (09/13/2010)  LDL:147 (09/13/2010)  Chol:249 (09/13/2010)  Trig:61 (09/13/2010)  Complete Medication List: 1)  Vitamin D 6000 Unit Tabs (cholecalciferol)  .... Take 1 tablet by mouth once a day 2)  Vitamin E  .... Take 1 tablet by mouth three times a day 3)  Nasacort Aq 55 Mcg/act Aers (Triamcinolone acetonide) .... 2 puffs each nostril once daily 4)  Lipotropic Adjunct (supplement For Cholesterol)  .... Once daily 5)  Losartan Potassium-hctz 50-12.5 Mg Tabs (Losartan potassium-hctz) .... One by mouth once daily for high blood pressure  Patient Instructions: 1)  Please schedule a follow-up appointment in 4 months. 2)  Check your Blood Pressure regularly. If it is above 140/90: you should make an appointment. Prescriptions: LOSARTAN POTASSIUM-HCTZ 50-12.5 MG TABS (LOSARTAN POTASSIUM-HCTZ) One by mouth once daily for high blood pressure  #30 x 11   Entered and Authorized by:   Etta Grandchild MD   Signed by:   Etta Grandchild MD on 01/23/2011   Method used:   Electronically to        [facility]  [address] [id number] (retail)       [address]       [place], [place]  [address]       Ph: [phone] or [phone]       Fax: [phone]   RxID:   [id number]    Orders Added: 1)  Venipuncture [76160] 2)  TLB-BMP (Basic Metabolic Panel-BMET) [80048-METABOL] 3)  Venipuncture [73710] 4)  TLB-Hepatic/Liver Function Pnl [80076-HEPATIC] 5)  TLB-Lipid Panel [80061-LIPID] 6)  Est. Patient Level III [62694]

## 2011-03-06 LAB — COMPREHENSIVE METABOLIC PANEL
ALT: 20 U/L (ref 0–35)
Alkaline Phosphatase: 59 U/L (ref 39–117)
BUN: 7 mg/dL (ref 6–23)
CO2: 30 mEq/L (ref 19–32)
Chloride: 101 mEq/L (ref 96–112)
GFR calc non Af Amer: >60 mL/min (ref >60)
Glucose, Bld: 93 mg/dL (ref 70–99)
Potassium: 3.3 mEq/L — ABNORMAL LOW (ref 3.5–5.1)
Sodium: 139 mEq/L (ref 135–145)
Total Bilirubin: 0.9 mg/dL (ref 0.3–1.2)
Total Protein: 7.3 g/dL (ref 6.0–8.3)

## 2011-03-06 LAB — URINALYSIS, ROUTINE W REFLEX MICROSCOPIC
Bilirubin Urine: NEGATIVE
Hgb urine dipstick: NEGATIVE
Ketones, ur: NEGATIVE mg/dL
Protein, ur: NEGATIVE mg/dL
Urobilinogen, UA: 1 mg/dL (ref 0.0–1.0)

## 2011-03-06 LAB — CBC
MCHC: 34.9 g/dL (ref 30.0–36.0)
MCV: 85.8 fL (ref 78.0–100.0)
RDW: 14.1 % (ref 11.5–15.5)

## 2011-03-06 LAB — DIFFERENTIAL
Basophils Absolute: 0 10*3/uL (ref 0.0–0.1)
Basophils Relative: 0 % (ref 0–1)
Eosinophils Absolute: 0.1 10*3/uL (ref 0.0–0.7)
Monocytes Relative: 6 % (ref 3–12)
Neutrophils Relative %: 64 % (ref 43–77)

## 2011-05-28 ENCOUNTER — Encounter: Payer: Self-pay | Admitting: Internal Medicine

## 2011-05-29 ENCOUNTER — Encounter: Payer: Self-pay | Admitting: Internal Medicine

## 2011-05-29 ENCOUNTER — Ambulatory Visit (INDEPENDENT_AMBULATORY_CARE_PROVIDER_SITE_OTHER): Payer: 59 | Admitting: Internal Medicine

## 2011-05-29 DIAGNOSIS — E785 Hyperlipidemia, unspecified: Secondary | ICD-10-CM

## 2011-05-29 DIAGNOSIS — I10 Essential (primary) hypertension: Secondary | ICD-10-CM

## 2011-05-29 DIAGNOSIS — E559 Vitamin D deficiency, unspecified: Secondary | ICD-10-CM

## 2011-05-29 NOTE — Progress Notes (Signed)
  Subjective:    Patient ID: Gabrielle Long, female    DOB: [birth year], 50 y.o.   MRN: [id number]  Hypertension This is a chronic problem. The current episode started more than 1 year ago. The problem has been gradually improving since onset. The problem is controlled. Pertinent negatives include no anxiety, blurred vision, chest pain, headaches, malaise/fatigue, neck pain, orthopnea, palpitations, peripheral edema, PND, shortness of breath or sweats. Past treatments include angiotensin blockers and diuretics. The current treatment provides moderate improvement. Compliance problems include psychosocial issues, exercise and diet.       Review of Systems  Constitutional: Negative.  Negative for malaise/fatigue.  HENT: Negative.  Negative for neck pain.   Eyes: Negative.  Negative for blurred vision.  Respiratory: Negative.  Negative for shortness of breath.   Cardiovascular: Negative.  Negative for chest pain, palpitations, orthopnea and PND.  Gastrointestinal: Negative.   Genitourinary: Negative.   Musculoskeletal: Negative.   Skin: Negative.   Neurological: Negative.  Negative for headaches.  Hematological: Negative.   Psychiatric/Behavioral: Negative.        Objective:   Physical Exam  Vitals reviewed. Constitutional: She is oriented to person, place, and time. She appears well-developed and well-nourished. No distress.  HENT:  Head: Normocephalic and atraumatic.  Right Ear: External ear normal.  Left Ear: External ear normal.  Nose: Nose normal.  Mouth/Throat: Oropharynx is clear and moist. No oropharyngeal exudate.  Eyes: Conjunctivae and EOM are normal. Pupils are equal, round, and reactive to light. Right eye exhibits no discharge. Left eye exhibits no discharge. No scleral icterus.  Neck: Normal range of motion. Neck supple. No JVD present. No tracheal deviation present. No thyromegaly present.  Cardiovascular: Normal rate, regular rhythm, normal heart sounds and intact  distal pulses.  Exam reveals no gallop and no friction rub.   No murmur heard. Pulmonary/Chest: Effort normal and breath sounds normal. No stridor. No respiratory distress. She has no wheezes. She has no rales. She exhibits no tenderness.  Abdominal: Soft. Bowel sounds are normal. She exhibits no distension and no mass. There is no tenderness. There is no rebound and no guarding.  Musculoskeletal: Normal range of motion. She exhibits no edema and no tenderness.  Lymphadenopathy:    She has no cervical adenopathy.  Neurological: She is alert and oriented to person, place, and time. She has normal reflexes. She displays normal reflexes. No cranial nerve deficit. She exhibits normal muscle tone. Coordination normal.  Skin: Skin is warm and dry. No rash noted. She is not diaphoretic. No erythema. No pallor.  Psychiatric: She has a normal mood and affect. Her behavior is normal. Judgment and thought content normal.        Lab Results  Component Value Date   WBC 5.4 09/13/2010   HGB 14.5 01/09/2009   HCT 38.4 09/13/2010   PLT 297 09/13/2010   CHOL 249 09/13/2010   HDL 90 09/13/2010   ALT 19 09/13/2010   AST 18 09/13/2010   NA 142 09/13/2010   K 3.8 09/13/2010   CL 102 09/13/2010   CREATININE 0.98 09/13/2010   BUN 13 09/13/2010   CO2 26 09/13/2010   TSH 0.934 09/12/2010    Assessment & Plan:

## 2011-05-29 NOTE — Assessment & Plan Note (Signed)
Her BP is reasonably well controlled despite some non-compliance, I have ordered labs today at her request, they will be done at [facility]

## 2011-05-29 NOTE — Patient Instructions (Signed)

## 2011-06-13 ENCOUNTER — Encounter: Payer: Self-pay | Admitting: Internal Medicine

## 2011-07-26 ENCOUNTER — Encounter: Payer: Self-pay | Admitting: Internal Medicine

## 2011-09-26 ENCOUNTER — Ambulatory Visit: Payer: 59 | Admitting: Internal Medicine

## 2011-10-01 ENCOUNTER — Ambulatory Visit: Payer: 59 | Admitting: Internal Medicine

## 2011-10-19 ENCOUNTER — Encounter: Payer: Self-pay | Admitting: Internal Medicine

## 2011-10-19 ENCOUNTER — Ambulatory Visit (INDEPENDENT_AMBULATORY_CARE_PROVIDER_SITE_OTHER): Payer: 59 | Admitting: Internal Medicine

## 2011-10-19 VITALS — BP 124/82 | HR 78 | Temp 98.0°F | Resp 16 | Wt 163.0 lb

## 2011-10-19 DIAGNOSIS — I10 Essential (primary) hypertension: Secondary | ICD-10-CM

## 2011-10-19 DIAGNOSIS — K529 Noninfective gastroenteritis and colitis, unspecified: Secondary | ICD-10-CM

## 2011-10-19 DIAGNOSIS — K5289 Other specified noninfective gastroenteritis and colitis: Secondary | ICD-10-CM

## 2011-10-19 DIAGNOSIS — E785 Hyperlipidemia, unspecified: Secondary | ICD-10-CM

## 2011-10-19 MED ORDER — PROMETHAZINE HCL 12.5 MG PO TABS: 12.5000 mg | ORAL_TABLET | Freq: Four times a day (QID) | ORAL | Status: AC | PRN

## 2011-10-19 MED ORDER — PROMETHAZINE HCL 12.5 MG PO TABS: 12.5000 mg | ORAL_TABLET | Freq: Four times a day (QID) | ORAL | Status: DC | PRN

## 2011-10-19 NOTE — Progress Notes (Signed)
  Subjective:    Patient ID: Gabrielle Long, female    DOB: [birth year], 50 y.o.   MRN: [id number]  Emesis  This is a new problem. The current episode started today. The problem occurs less than 2 times per day. The problem has been unchanged. The emesis has an appearance of bile. There has been no fever. Associated symptoms include dizziness. Pertinent negatives include no abdominal pain, chest pain, chills, coughing, diarrhea, fever, headaches, myalgias, sweats, URI or weight loss. Risk factors include ill contacts. She has tried nothing for the symptoms.  Hypertension This is a chronic problem. The current episode started more than 1 year ago. The problem has been gradually improving since onset. The problem is controlled. Pertinent negatives include no anxiety, blurred vision, chest pain, headaches, malaise/fatigue, neck pain, orthopnea, palpitations, peripheral edema, PND, shortness of breath or sweats. There are no associated agents to hypertension. Past treatments include angiotensin blockers and diuretics. The current treatment provides significant improvement. There are no compliance problems.       Review of Systems  Constitutional: Negative for fever, chills, weight loss, malaise/fatigue, diaphoresis, activity change, appetite change, fatigue and unexpected weight change.  HENT: Negative for sore throat, trouble swallowing, neck pain and voice change.   Eyes: Negative.  Negative for blurred vision.  Respiratory: Negative for apnea, cough, choking, chest tightness, shortness of breath, wheezing and stridor.   Cardiovascular: Negative for chest pain, palpitations, orthopnea and PND.  Gastrointestinal: Positive for nausea and vomiting. Negative for abdominal pain, diarrhea, constipation, blood in stool, abdominal distention, anal bleeding and rectal pain.  Genitourinary: Negative.   Musculoskeletal: Negative.  Negative for myalgias.  Skin: Negative for color change, pallor, rash and wound.    Neurological: Positive for dizziness. Negative for tremors, seizures, syncope, facial asymmetry, speech difficulty, weakness, light-headedness, numbness and headaches.  Hematological: Negative for adenopathy. Does not bruise/bleed easily.  Psychiatric/Behavioral: Negative.        Objective:   Physical Exam  Vitals reviewed. Constitutional: She is oriented to person, place, and time. She appears well-developed and well-nourished. No distress.  HENT:  Head: Normocephalic and atraumatic.  Mouth/Throat: Oropharynx is clear and moist. No oropharyngeal exudate.  Eyes: Conjunctivae are normal. Right eye exhibits no discharge. Left eye exhibits no discharge. No scleral icterus.  Neck: Normal range of motion. Neck supple. No JVD present. No tracheal deviation present. No thyromegaly present.  Cardiovascular: Normal rate, regular rhythm, normal heart sounds and intact distal pulses.  Exam reveals no gallop and no friction rub.   No murmur heard. Pulmonary/Chest: Effort normal and breath sounds normal. No stridor. No respiratory distress. She has no wheezes. She has no rales. She exhibits no tenderness.  Abdominal: Soft. Bowel sounds are normal. She exhibits no distension and no mass. There is no tenderness. There is no rebound and no guarding.  Musculoskeletal: Normal range of motion. She exhibits no edema and no tenderness.  Lymphadenopathy:    She has no cervical adenopathy.  Neurological: She is oriented to person, place, and time.  Skin: Skin is warm and dry. No rash noted. She is not diaphoretic. No erythema. No pallor.  Psychiatric: She has a normal mood and affect. Her behavior is normal. Judgment and thought content normal.          Assessment & Plan:

## 2011-10-19 NOTE — Assessment & Plan Note (Signed)
Labs ordered.

## 2011-10-19 NOTE — Patient Instructions (Signed)
Viral Gastroenteritis Viral gastroenteritis is also known as stomach flu. This condition affects the stomach and intestinal tract. The illness typically lasts 3 to 8 days. Most people develop an immune response. This eventually gets rid of the virus. While this natural response develops, the virus can make you quite ill.  CAUSES  Diarrhea and vomiting are often caused by a virus. Medicines (antibiotics) that kill germs will not help unless there is also a germ (bacterial) infection. SYMPTOMS  The most common symptom is diarrhea. This can cause severe loss of fluids (dehydration) and body salt (electrolyte) imbalance. TREATMENT  Treatments for this illness are aimed at rehydration. Antidiarrheal medicines are not recommended. They do not decrease diarrhea volume and may be harmful. Usually, home treatment is all that is needed. The most serious cases involve vomiting so severely that you are not able to keep down fluids taken by mouth (orally). In these cases, intravenous (IV) fluids are needed. Vomiting with viral gastroenteritis is common, but it will usually go away with treatment. HOME CARE INSTRUCTIONS  Small amounts of fluids should be taken frequently. Large amounts at one time may not be tolerated. Plain water may be harmful in infants and the elderly. Oral rehydration solutions (ORS) are available at pharmacies and grocery stores. ORS replace water and important electrolytes in proper proportions. Sports drinks are not as effective as ORS and may be harmful due to sugars worsening diarrhea.  As a general guideline for children, replace any new fluid losses from diarrhea or vomiting with ORS as follows:   If your child weighs 22 pounds or under (10 kg or less), give 60-120 mL (1/4 - 1/2 cup or 2 - 4 ounces) of ORS for each diarrheal stool or vomiting episode.   If your child weighs more than 22 pounds (more than 10 kgs), give 120-240 mL (1/2 - 1 cup or 4 - 8 ounces) of ORS for each diarrheal  stool or vomiting episode.   In a child with vomiting, it may be helpful to give the above ORS replacement in 5 mL (1 teaspoon) amounts every 5 minutes, then increase as tolerated.   While correcting for dehydration, children should eat normally. However, foods high in sugar should be avoided because this may worsen diarrhea. Large amounts of carbonated soft drinks, juice, gelatin desserts, and other highly sugared drinks should be avoided.   After correction of dehydration, other liquids that are appealing to the child may be added. Children should drink small amounts of fluids frequently and fluids should be increased as tolerated.   Adults should eat normally while drinking more fluids than usual. Drink small amounts of fluids frequently and increase as tolerated. Drink enough water and fluids to keep your urine clear or pale yellow. Broths, weak decaffeinated tea, lemon-lime soft drinks (allowed to go flat), and ORS replace fluids and electrolytes.   Avoid:   Carbonated drinks.   Juice.   Extremely hot or cold fluids.   Caffeine drinks.   Fatty, greasy foods.   Alcohol.   Tobacco.   Too much intake of anything at one time.   Gelatin desserts.   Probiotics are active cultures of beneficial bacteria. They may lessen the amount and number of diarrheal stools in adults. Probiotics can be found in yogurt with active cultures and in supplements.   Wash your hands well to avoid spreading bacteria and viruses.   Antidiarrheal medicines are not recommended for infants and children.   Only take over-the-counter or prescription medicines for   pain, discomfort, or fever as directed by your caregiver. Do not give aspirin to children.   For adults with dehydration, ask your caregiver if you should continue all prescribed and over-the-counter medicines.   If your caregiver has given you a follow-up appointment, it is very important to keep that appointment. Not keeping the appointment  could result in a lasting (chronic) or permanent injury and disability. If there is any problem keeping the appointment, you must call to reschedule.  SEEK IMMEDIATE MEDICAL CARE IF:   You are unable to keep fluids down.   There is no urine output in 6 to 8 hours or there is only a small amount of very dark urine.   You develop shortness of breath.   There is blood in the vomit (may look like coffee grounds) or stool.   Belly (abdominal) pain develops, increases, or localizes.   There is persistent vomiting or diarrhea.   You have a fever.   Your baby is older than 3 months with a rectal temperature of 102 F (38.9 C) or higher.   Your baby is 3 months old or younger with a rectal temperature of 100.4 F (38 C) or higher.  MAKE SURE YOU:   Understand these instructions.   Will watch your condition.   Will get help right away if you are not doing well or get worse.  Document Released: 11/05/2005 Document Revised: 07/18/2011 Document Reviewed: 03/19/2007 [employer] Patient Information 2012 [employer]. 

## 2011-10-19 NOTE — Assessment & Plan Note (Signed)
This is a self-limited viral illness, I have asked her to go home and rest and take phenergan as needed

## 2011-10-19 NOTE — Assessment & Plan Note (Signed)
Her BP is well controlled, I will check her lytes and renal function 

## 2011-10-30 ENCOUNTER — Encounter: Payer: Self-pay | Admitting: Internal Medicine

## 2012-02-04 ENCOUNTER — Encounter: Payer: Self-pay | Admitting: Gastroenterology

## 2013-02-16 ENCOUNTER — Encounter: Payer: Self-pay | Admitting: Internal Medicine

## 2013-02-16 ENCOUNTER — Ambulatory Visit (INDEPENDENT_AMBULATORY_CARE_PROVIDER_SITE_OTHER): Payer: 59 | Admitting: Internal Medicine

## 2013-02-16 VITALS — BP 118/80 | HR 83 | Temp 97.8°F | Resp 16 | Ht 65.5 in | Wt 166.0 lb

## 2013-02-16 DIAGNOSIS — J3089 Other allergic rhinitis: Secondary | ICD-10-CM

## 2013-02-16 DIAGNOSIS — Z0000 Encounter for general adult medical examination without abnormal findings: Secondary | ICD-10-CM | POA: Insufficient documentation

## 2013-02-16 DIAGNOSIS — J45909 Unspecified asthma, uncomplicated: Secondary | ICD-10-CM

## 2013-02-16 DIAGNOSIS — E559 Vitamin D deficiency, unspecified: Secondary | ICD-10-CM

## 2013-02-16 DIAGNOSIS — Z23 Encounter for immunization: Secondary | ICD-10-CM

## 2013-02-16 DIAGNOSIS — I10 Essential (primary) hypertension: Secondary | ICD-10-CM

## 2013-02-16 DIAGNOSIS — B9689 Other specified bacterial agents as the cause of diseases classified elsewhere: Secondary | ICD-10-CM

## 2013-02-16 DIAGNOSIS — A499 Bacterial infection, unspecified: Secondary | ICD-10-CM

## 2013-02-16 DIAGNOSIS — J329 Chronic sinusitis, unspecified: Secondary | ICD-10-CM

## 2013-02-16 MED ORDER — MOMETASONE FUROATE 50 MCG/ACT NA SUSP: 4.0000 | Freq: Every day | NASAL | Status: DC

## 2013-02-16 MED ORDER — METHYLPREDNISOLONE ACETATE 40 MG/ML IJ SUSP
40.0000 mg | Freq: Once | INTRAMUSCULAR | Status: AC
  Administered 2013-02-16: 40 mg via INTRAMUSCULAR

## 2013-02-16 MED ORDER — AMOXICILLIN-POT CLAVULANATE 875-125 MG PO TABS: 1.0000 | ORAL_TABLET | Freq: Two times a day (BID) | ORAL | Status: DC

## 2013-02-16 MED ORDER — METHYLPREDNISOLONE ACETATE 80 MG/ML IJ SUSP
80.0000 mg | Freq: Once | INTRAMUSCULAR | Status: AC
  Administered 2013-02-16: 80 mg via INTRAMUSCULAR

## 2013-02-16 NOTE — Progress Notes (Signed)
Subjective:    Patient ID: Gabrielle Long, female    DOB: [birth year], 52 y.o.   MRN: [id number]  Sinusitis This is a recurrent problem. The current episode started 1 to 4 weeks ago. The problem has been gradually worsening since onset. There has been no fever. The fever has been present for less than 1 day. Her pain is at a severity of 0/10. She is experiencing no pain. Associated symptoms include congestion, sinus pressure, sneezing and a sore throat. Pertinent negatives include no chills, coughing, diaphoresis, ear pain, headaches, hoarse voice, neck pain, shortness of breath or swollen glands. Past treatments include oral decongestants. The treatment provided mild relief.      Review of Systems  Constitutional: Negative.  Negative for fever, chills, diaphoresis, activity change, appetite change, fatigue and unexpected weight change.  HENT: Positive for congestion, sore throat, rhinorrhea, sneezing, postnasal drip and sinus pressure. Negative for ear pain, nosebleeds, hoarse voice, facial swelling, drooling, mouth sores, trouble swallowing, neck pain, dental problem and voice change.   Eyes: Negative.   Respiratory: Negative.  Negative for cough, shortness of breath, wheezing and stridor.   Cardiovascular: Negative.  Negative for chest pain, palpitations and leg swelling.  Gastrointestinal: Negative.  Negative for nausea, vomiting, abdominal pain, diarrhea, constipation and blood in stool.  Endocrine: Negative.   Genitourinary: Negative.   Musculoskeletal: Negative.  Negative for myalgias, back pain, joint swelling, arthralgias and gait problem.  Skin: Negative.  Negative for color change, pallor, rash and wound.  Allergic/Immunologic: Negative.   Neurological: Negative.  Negative for dizziness, weakness and headaches.  Hematological: Negative.  Negative for adenopathy. Does not bruise/bleed easily.  Psychiatric/Behavioral: Negative.        Objective:   Physical Exam  Vitals  reviewed. Constitutional: She is oriented to person, place, and time. She appears well-developed and well-nourished.  Non-toxic appearance. She does not have a sickly appearance. She does not appear ill. No distress.  HENT:  Head: Normocephalic. No trismus in the jaw.  Right Ear: Hearing, tympanic membrane, external ear and ear canal normal.  Left Ear: Hearing, tympanic membrane, external ear and ear canal normal.  Nose: Mucosal edema and rhinorrhea present. No nose lacerations, sinus tenderness, nasal deformity, septal deviation or nasal septal hematoma. No epistaxis.  No foreign bodies. Right sinus exhibits maxillary sinus tenderness. Right sinus exhibits no frontal sinus tenderness. Left sinus exhibits maxillary sinus tenderness. Left sinus exhibits no frontal sinus tenderness.  Mouth/Throat: Mucous membranes are not pale, not dry and not cyanotic. No oral lesions. No edematous. No oropharyngeal exudate, posterior oropharyngeal edema, posterior oropharyngeal erythema or tonsillar abscesses.  Eyes: Conjunctivae are normal. Right eye exhibits no discharge. Left eye exhibits no discharge. No scleral icterus.  Neck: Normal range of motion. Neck supple. No JVD present. No tracheal deviation present. No thyromegaly present.  Cardiovascular: Normal rate, regular rhythm, normal heart sounds and intact distal pulses.  Exam reveals no gallop and no friction rub.   No murmur heard. Pulmonary/Chest: Effort normal and breath sounds normal. No stridor. No respiratory distress. She has no wheezes. She has no rales. She exhibits no tenderness.  Abdominal: Soft. Bowel sounds are normal. She exhibits no distension and no mass. There is no tenderness. There is no rebound and no guarding.  Musculoskeletal: Normal range of motion. She exhibits no edema and no tenderness.  Lymphadenopathy:    She has no cervical adenopathy.  Neurological: She is oriented to person, place, and time.  Skin: Skin is warm and dry. No  rash noted. She is not diaphoretic. No erythema. No pallor.  Psychiatric: She has a normal mood and affect. Her behavior is normal. Judgment and thought content normal.     Lab Results  Component Value Date   WBC 5.4 09/13/2010   HGB 14.5 01/09/2009   HCT 38.4 09/13/2010   PLT 297 09/13/2010   GLUCOSE 83 09/13/2010   CHOL 249 09/13/2010   HDL 90 09/13/2010   LDLCALC 147 09/13/2010   ALT 19 09/13/2010   AST 18 09/13/2010   NA 142 09/13/2010   K 3.8 09/13/2010   CL 102 09/13/2010   CREATININE 0.98 09/13/2010   BUN 13 09/13/2010   CO2 26 09/13/2010   TSH 0.934 09/12/2010       Assessment & Plan:

## 2013-02-16 NOTE — Assessment & Plan Note (Signed)
I will recheck her Vit D level

## 2013-02-16 NOTE — Patient Instructions (Signed)
Preventive Care for Adults, Female A healthy lifestyle and preventive care can promote health and wellness. Preventive health guidelines for women include the following key practices.  A routine yearly physical is a good way to check with your caregiver about your health and preventive screening. It is a chance to share any concerns and updates on your health, and to receive a thorough exam.  Visit your dentist for a routine exam and preventive care every 6 months. Brush your teeth twice a day and floss once a day. Good oral hygiene prevents tooth decay and gum disease.  The frequency of eye exams is based on your age, health, family medical history, use of contact lenses, and other factors. Follow your caregiver's recommendations for frequency of eye exams.  Eat a healthy diet. Foods like vegetables, fruits, whole grains, low-fat dairy products, and lean protein foods contain the nutrients you need without too many calories. Decrease your intake of foods high in solid fats, added sugars, and salt. Eat the right amount of calories for you.Get information about a proper diet from your caregiver, if necessary.  Regular physical exercise is one of the most important things you can do for your health. Most adults should get at least 150 minutes of moderate-intensity exercise (any activity that increases your heart rate and causes you to sweat) each week. In addition, most adults need muscle-strengthening exercises on 2 or more days a week.  Maintain a healthy weight. The body mass index (BMI) is a screening tool to identify possible weight problems. It provides an estimate of body fat based on height and weight. Your caregiver can help determine your BMI, and can help you achieve or maintain a healthy weight.For adults 20 years and older:  A BMI below 18.5 is considered underweight.  A BMI of 18.5 to 24.9 is normal.  A BMI of 25 to 29.9 is considered overweight.  A BMI of 30 and above is  considered obese.  Maintain normal blood lipids and cholesterol levels by exercising and minimizing your intake of saturated fat. Eat a balanced diet with plenty of fruit and vegetables. Blood tests for lipids and cholesterol should begin at age 20 and be repeated every 5 years. If your lipid or cholesterol levels are high, you are over 50, or you are at high risk for heart disease, you may need your cholesterol levels checked more frequently.Ongoing high lipid and cholesterol levels should be treated with medicines if diet and exercise are not effective.  If you smoke, find out from your caregiver how to quit. If you do not use tobacco, do not start.  If you are pregnant, do not drink alcohol. If you are breastfeeding, be very cautious about drinking alcohol. If you are not pregnant and choose to drink alcohol, do not exceed 1 drink per day. One drink is considered to be 12 ounces (355 mL) of beer, 5 ounces (148 mL) of wine, or 1.5 ounces (44 mL) of liquor.  Avoid use of street drugs. Do not share needles with anyone. Ask for help if you need support or instructions about stopping the use of drugs.  High blood pressure causes heart disease and increases the risk of stroke. Your blood pressure should be checked at least every 1 to 2 years. Ongoing high blood pressure should be treated with medicines if weight loss and exercise are not effective.  If you are 55 to 51 years old, ask your caregiver if you should take aspirin to prevent strokes.  Diabetes   screening involves taking a blood sample to check your fasting blood sugar level. This should be done once every 3 years, after age 45, if you are within normal weight and without risk factors for diabetes. Testing should be considered at a younger age or be carried out more frequently if you are overweight and have at least 1 risk factor for diabetes.  Breast cancer screening is essential preventive care for women. You should practice "breast  self-awareness." This means understanding the normal appearance and feel of your breasts and may include breast self-examination. Any changes detected, no matter how small, should be reported to a caregiver. Women in their 20s and 30s should have a clinical breast exam (CBE) by a caregiver as part of a regular health exam every 1 to 3 years. After age 40, women should have a CBE every year. Starting at age 40, women should consider having a mammography (breast X-ray test) every year. Women who have a family history of breast cancer should talk to their caregiver about genetic screening. Women at a high risk of breast cancer should talk to their caregivers about having magnetic resonance imaging (MRI) and a mammography every year.  The Pap test is a screening test for cervical cancer. A Pap test can show cell changes on the cervix that might become cervical cancer if left untreated. A Pap test is a procedure in which cells are obtained and examined from the lower end of the uterus (cervix).  Women should have a Pap test starting at age 21.  Between ages 21 and 29, Pap tests should be repeated every 2 years.  Beginning at age 30, you should have a Pap test every 3 years as long as the past 3 Pap tests have been normal.  Some women have medical problems that increase the chance of getting cervical cancer. Talk to your caregiver about these problems. It is especially important to talk to your caregiver if a new problem develops soon after your last Pap test. In these cases, your caregiver may recommend more frequent screening and Pap tests.  The above recommendations are the same for women who have or have not gotten the vaccine for human papillomavirus (HPV).  If you had a hysterectomy for a problem that was not cancer or a condition that could lead to cancer, then you no longer need Pap tests. Even if you no longer need a Pap test, a regular exam is a good idea to make sure no other problems are  starting.  If you are between ages 65 and 70, and you have had normal Pap tests going back 10 years, you no longer need Pap tests. Even if you no longer need a Pap test, a regular exam is a good idea to make sure no other problems are starting.  If you have had past treatment for cervical cancer or a condition that could lead to cancer, you need Pap tests and screening for cancer for at least 20 years after your treatment.  If Pap tests have been discontinued, risk factors (such as a new sexual partner) need to be reassessed to determine if screening should be resumed.  The HPV test is an additional test that may be used for cervical cancer screening. The HPV test looks for the virus that can cause the cell changes on the cervix. The cells collected during the Pap test can be tested for HPV. The HPV test could be used to screen women aged 30 years and older, and should   be used in women of any age who have unclear Pap test results. After the age of 30, women should have HPV testing at the same frequency as a Pap test.  Colorectal cancer can be detected and often prevented. Most routine colorectal cancer screening begins at the age of 50 and continues through age 75. However, your caregiver may recommend screening at an earlier age if you have risk factors for colon cancer. On a yearly basis, your caregiver may provide home test kits to check for hidden blood in the stool. Use of a small camera at the end of a tube, to directly examine the colon (sigmoidoscopy or colonoscopy), can detect the earliest forms of colorectal cancer. Talk to your caregiver about this at age 50, when routine screening begins. Direct examination of the colon should be repeated every 5 to 10 years through age 75, unless early forms of pre-cancerous polyps or small growths are found.  Hepatitis C blood testing is recommended for all people born from 1945 through 1965 and any individual with known risks for hepatitis C.  Practice  safe sex. Use condoms and avoid high-risk sexual practices to reduce the spread of sexually transmitted infections (STIs). STIs include gonorrhea, chlamydia, syphilis, trichomonas, herpes, HPV, and human immunodeficiency virus (HIV). Herpes, HIV, and HPV are viral illnesses that have no cure. They can result in disability, cancer, and death. Sexually active women aged 25 and younger should be checked for chlamydia. Older women with new or multiple partners should also be tested for chlamydia. Testing for other STIs is recommended if you are sexually active and at increased risk.  Osteoporosis is a disease in which the bones lose minerals and strength with aging. This can result in serious bone fractures. The risk of osteoporosis can be identified using a bone density scan. Women ages 65 and over and women at risk for fractures or osteoporosis should discuss screening with their caregivers. Ask your caregiver whether you should take a calcium supplement or vitamin D to reduce the rate of osteoporosis.  Menopause can be associated with physical symptoms and risks. Hormone replacement therapy is available to decrease symptoms and risks. You should talk to your caregiver about whether hormone replacement therapy is right for you.  Use sunscreen with sun protection factor (SPF) of 30 or more. Apply sunscreen liberally and repeatedly throughout the day. You should seek shade when your shadow is shorter than you. Protect yourself by wearing long sleeves, pants, a wide-brimmed hat, and sunglasses year round, whenever you are outdoors.  Once a month, do a whole body skin exam, using a mirror to look at the skin on your back. Notify your caregiver of new moles, moles that have irregular borders, moles that are larger than a pencil eraser, or moles that have changed in shape or color.  Stay current with required immunizations.  Influenza. You need a dose every fall (or winter). The composition of the flu vaccine  changes each year, so being vaccinated once is not enough.  Pneumococcal polysaccharide. You need 1 to 2 doses if you smoke cigarettes or if you have certain chronic medical conditions. You need 1 dose at age 65 (or older) if you have never been vaccinated.  Tetanus, diphtheria, pertussis (Tdap, Td). Get 1 dose of Tdap vaccine if you are younger than age 65, are over 65 and have contact with an infant, are a healthcare worker, are pregnant, or simply want to be protected from whooping cough. After that, you need a Td   booster dose every 10 years. Consult your caregiver if you have not had at least 3 tetanus and diphtheria-containing shots sometime in your life or have a deep or dirty wound.  HPV. You need this vaccine if you are a woman age 26 or younger. The vaccine is given in 3 doses over 6 months.  Measles, mumps, rubella (MMR). You need at least 1 dose of MMR if you were born in 1957 or later. You may also need a second dose.  Meningococcal. If you are age 19 to 21 and a first-year college student living in a residence hall, or have one of several medical conditions, you need to get vaccinated against meningococcal disease. You may also need additional booster doses.  Zoster (shingles). If you are age 60 or older, you should get this vaccine.  Varicella (chickenpox). If you have never had chickenpox or you were vaccinated but received only 1 dose, talk to your caregiver to find out if you need this vaccine.  Hepatitis A. You need this vaccine if you have a specific risk factor for hepatitis A virus infection or you simply wish to be protected from this disease. The vaccine is usually given as 2 doses, 6 to 18 months apart.  Hepatitis B. You need this vaccine if you have a specific risk factor for hepatitis B virus infection or you simply wish to be protected from this disease. The vaccine is given in 3 doses, usually over 6 months. Preventive Services / Frequency Ages 19 to 39  Blood  pressure check.** / Every 1 to 2 years.  Lipid and cholesterol check.** / Every 5 years beginning at age 20.  Clinical breast exam.** / Every 3 years for women in their 20s and 30s.  Pap test.** / Every 2 years from ages 21 through 29. Every 3 years starting at age 30 through age 65 or 70 with a history of 3 consecutive normal Pap tests.  HPV screening.** / Every 3 years from ages 30 through ages 65 to 70 with a history of 3 consecutive normal Pap tests.  Hepatitis C blood test.** / For any individual with known risks for hepatitis C.  Skin self-exam. / Monthly.  Influenza immunization.** / Every year.  Pneumococcal polysaccharide immunization.** / 1 to 2 doses if you smoke cigarettes or if you have certain chronic medical conditions.  Tetanus, diphtheria, pertussis (Tdap, Td) immunization. / A one-time dose of Tdap vaccine. After that, you need a Td booster dose every 10 years.  HPV immunization. / 3 doses over 6 months, if you are 26 and younger.  Measles, mumps, rubella (MMR) immunization. / You need at least 1 dose of MMR if you were born in 1957 or later. You may also need a second dose.  Meningococcal immunization. / 1 dose if you are age 19 to 21 and a first-year college student living in a residence hall, or have one of several medical conditions, you need to get vaccinated against meningococcal disease. You may also need additional booster doses.  Varicella immunization.** / Consult your caregiver.  Hepatitis A immunization.** / Consult your caregiver. 2 doses, 6 to 18 months apart.  Hepatitis B immunization.** / Consult your caregiver. 3 doses usually over 6 months. Ages 40 to 64  Blood pressure check.** / Every 1 to 2 years.  Lipid and cholesterol check.** / Every 5 years beginning at age 20.  Clinical breast exam.** / Every year after age 40.  Mammogram.** / Every year beginning at age 40   and continuing for as long as you are in good health. Consult with your  caregiver.  Pap test.** / Every 3 years starting at age 30 through age 65 or 70 with a history of 3 consecutive normal Pap tests.  HPV screening.** / Every 3 years from ages 30 through ages 65 to 70 with a history of 3 consecutive normal Pap tests.  Fecal occult blood test (FOBT) of stool. / Every year beginning at age 50 and continuing until age 75. You may not need to do this test if you get a colonoscopy every 10 years.  Flexible sigmoidoscopy or colonoscopy.** / Every 5 years for a flexible sigmoidoscopy or every 10 years for a colonoscopy beginning at age 50 and continuing until age 75.  Hepatitis C blood test.** / For all people born from 1945 through 1965 and any individual with known risks for hepatitis C.  Skin self-exam. / Monthly.  Influenza immunization.** / Every year.  Pneumococcal polysaccharide immunization.** / 1 to 2 doses if you smoke cigarettes or if you have certain chronic medical conditions.  Tetanus, diphtheria, pertussis (Tdap, Td) immunization.** / A one-time dose of Tdap vaccine. After that, you need a Td booster dose every 10 years.  Measles, mumps, rubella (MMR) immunization. / You need at least 1 dose of MMR if you were born in 1957 or later. You may also need a second dose.  Varicella immunization.** / Consult your caregiver.  Meningococcal immunization.** / Consult your caregiver.  Hepatitis A immunization.** / Consult your caregiver. 2 doses, 6 to 18 months apart.  Hepatitis B immunization.** / Consult your caregiver. 3 doses, usually over 6 months. Ages 65 and over  Blood pressure check.** / Every 1 to 2 years.  Lipid and cholesterol check.** / Every 5 years beginning at age 20.  Clinical breast exam.** / Every year after age 40.  Mammogram.** / Every year beginning at age 40 and continuing for as long as you are in good health. Consult with your caregiver.  Pap test.** / Every 3 years starting at age 30 through age 65 or 70 with a 3  consecutive normal Pap tests. Testing can be stopped between 65 and 70 with 3 consecutive normal Pap tests and no abnormal Pap or HPV tests in the past 10 years.  HPV screening.** / Every 3 years from ages 30 through ages 65 or 70 with a history of 3 consecutive normal Pap tests. Testing can be stopped between 65 and 70 with 3 consecutive normal Pap tests and no abnormal Pap or HPV tests in the past 10 years.  Fecal occult blood test (FOBT) of stool. / Every year beginning at age 50 and continuing until age 75. You may not need to do this test if you get a colonoscopy every 10 years.  Flexible sigmoidoscopy or colonoscopy.** / Every 5 years for a flexible sigmoidoscopy or every 10 years for a colonoscopy beginning at age 50 and continuing until age 75.  Hepatitis C blood test.** / For all people born from 1945 through 1965 and any individual with known risks for hepatitis C.  Osteoporosis screening.** / A one-time screening for women ages 65 and over and women at risk for fractures or osteoporosis.  Skin self-exam. / Monthly.  Influenza immunization.** / Every year.  Pneumococcal polysaccharide immunization.** / 1 dose at age 65 (or older) if you have never been vaccinated.  Tetanus, diphtheria, pertussis (Tdap, Td) immunization. / A one-time dose of Tdap vaccine if you are over   65 and have contact with an infant, are a healthcare worker, or simply want to be protected from whooping cough. After that, you need a Td booster dose every 10 years.  Varicella immunization.** / Consult your caregiver.  Meningococcal immunization.** / Consult your caregiver.  Hepatitis A immunization.** / Consult your caregiver. 2 doses, 6 to 18 months apart.  Hepatitis B immunization.** / Check with your caregiver. 3 doses, usually over 6 months. ** Family history and personal history of risk and conditions may change your caregiver's recommendations. Document Released: 01/01/2002 Document Revised: 01/28/2012  Document Reviewed: 04/02/2011 [employer] Patient Information 2013 [employer].  

## 2013-02-16 NOTE — Assessment & Plan Note (Signed)
Start augmentin for the infection

## 2013-02-16 NOTE — Assessment & Plan Note (Signed)
Exam done Vaccines were reviewed and updated Labs ordered Pt ed material was given 

## 2013-02-16 NOTE — Assessment & Plan Note (Signed)
Her BP is well controlled Today I will check her lytes and renal function 

## 2013-02-16 NOTE — Assessment & Plan Note (Signed)
She is having a flare up of symptoms so I gave her an injection of depo-medrol IM She will use nasonex as well

## 2013-02-24 ENCOUNTER — Encounter: Payer: Self-pay | Admitting: Internal Medicine

## 2013-02-24 LAB — CBC WITH DIFFERENTIAL/PLATELET
EOS: 3 %
HCT: 41 %
Immature Grans (Abs): 0
Immature Granulocytes: 0
Lymphs Abs: 2.2
MONO#: 6
NEUT#: 340
WBC: 5.1
lymph#: 44
platelet count: 340

## 2013-02-24 LAB — COMPREHENSIVE METABOLIC PANEL
ALT: 34 U/L (ref 7–35)
AST: 26 U/L
Alkaline Phosphatase: 74 U/L
BUN/Creatinine Ratio: 12
Chloride, Serum: 98
EGFR: 67 mg/dL
Sodium: 140 mmol/L (ref 137–147)
Total Bilirubin: 0.2 mg/dL

## 2013-02-24 LAB — LIPID PANEL: Vit D, 25-Hydroxy: 49.8

## 2013-02-24 LAB — URINALYSIS
Ketones: NEGATIVE
Nitrite, UA: NEGATIVE
Occult Blood: NEGATIVE
Specific Gravity, Urine: 1.018
Urobilinogen, UA: NORMAL

## 2013-02-25 ENCOUNTER — Encounter: Payer: Self-pay | Admitting: Gastroenterology

## 2013-04-01 LAB — HM MAMMOGRAPHY: HM Mammogram: NORMAL

## 2013-05-11 ENCOUNTER — Telehealth: Payer: Self-pay | Admitting: Internal Medicine

## 2013-05-11 ENCOUNTER — Other Ambulatory Visit: Payer: Self-pay | Admitting: Internal Medicine

## 2013-05-11 DIAGNOSIS — I10 Essential (primary) hypertension: Secondary | ICD-10-CM

## 2013-05-11 MED ORDER — LOSARTAN POTASSIUM-HCTZ 50-12.5 MG PO TABS: 1.0000 | ORAL_TABLET | Freq: Every day | ORAL | Status: DC

## 2013-05-11 NOTE — Telephone Encounter (Signed)
The pt is hoping to get a refill of Losartan called into the pharmacy [facility] on [address]  Callback [phone]

## 2013-05-11 NOTE — Telephone Encounter (Signed)
Please advise refill? 

## 2013-08-18 ENCOUNTER — Ambulatory Visit (INDEPENDENT_AMBULATORY_CARE_PROVIDER_SITE_OTHER): Payer: 59 | Admitting: Internal Medicine

## 2013-08-18 ENCOUNTER — Encounter: Payer: Self-pay | Admitting: Internal Medicine

## 2013-08-18 VITALS — BP 138/88 | HR 86 | Temp 97.8°F | Resp 16 | Ht 65.5 in | Wt 169.0 lb

## 2013-08-18 DIAGNOSIS — R7309 Other abnormal glucose: Secondary | ICD-10-CM

## 2013-08-18 DIAGNOSIS — I10 Essential (primary) hypertension: Secondary | ICD-10-CM

## 2013-08-18 DIAGNOSIS — R739 Hyperglycemia, unspecified: Secondary | ICD-10-CM | POA: Insufficient documentation

## 2013-08-18 NOTE — Assessment & Plan Note (Signed)
Her BP is well controlled She has decided not to take meds for this

## 2013-08-18 NOTE — Progress Notes (Signed)
  Subjective:    Patient ID: Gabrielle Long, female    DOB: 1961-03-23, 52 y.o.   MRN: [id number]  Hypertension This is a chronic problem. The current episode started more than 1 year ago. The problem has been rapidly improving since onset. The problem is controlled. Pertinent negatives include no anxiety, blurred vision, chest pain, headaches, malaise/fatigue, neck pain, orthopnea, palpitations, peripheral edema, PND, shortness of breath or sweats. Past treatments include angiotensin blockers and diuretics. The current treatment provides significant improvement. Compliance problems include psychosocial issues.       Review of Systems  Constitutional: Negative for malaise/fatigue.  HENT: Negative for neck pain.   Eyes: Negative for blurred vision.  Respiratory: Negative for shortness of breath.   Cardiovascular: Negative for chest pain, palpitations, orthopnea and PND.  Neurological: Negative for headaches.  All other systems reviewed and are negative.       Objective:   Physical Exam  Vitals reviewed. Constitutional: She is oriented to person, place, and time. She appears well-developed and well-nourished. No distress.  HENT:  Head: Normocephalic and atraumatic.  Mouth/Throat: Oropharynx is clear and moist. No oropharyngeal exudate.  Eyes: Conjunctivae are normal. Right eye exhibits no discharge. Left eye exhibits no discharge. No scleral icterus.  Neck: Normal range of motion. Neck supple. No JVD present. No tracheal deviation present. No thyromegaly present.  Cardiovascular: Normal rate, regular rhythm, normal heart sounds and intact distal pulses.  Exam reveals no gallop and no friction rub.   No murmur heard. Pulmonary/Chest: Effort normal and breath sounds normal. No stridor. No respiratory distress. She has no wheezes. She has no rales. She exhibits no tenderness.  Abdominal: Soft. Bowel sounds are normal. She exhibits no distension and no mass. There is no tenderness. There is  no rebound and no guarding.  Musculoskeletal: Normal range of motion. She exhibits no edema and no tenderness.  Lymphadenopathy:    She has no cervical adenopathy.  Neurological: She is oriented to person, place, and time.  Skin: Skin is warm and dry. No rash noted. She is not diaphoretic. No erythema. No pallor.  Psychiatric: She has a normal mood and affect. Her behavior is normal. Judgment and thought content normal.     Lab Results  Component Value Date   WBC 5.1 02/16/2013   HGB 14.0 02/16/2013   HCT 41 02/16/2013   PLT 297 09/13/2010   GLUCOSE 83 09/13/2010   CHOL 250* 02/16/2013   TRIG 96 02/16/2013   HDL 71* 02/16/2013   LDLCALC 160 02/16/2013   ALT 34 02/16/2013   AST 26 02/16/2013   NA 140 02/16/2013   K 3.8 09/13/2010   CL 102 09/13/2010   CREATININE 0.98 09/13/2010   BUN 12 02/16/2013   CO2 26 09/13/2010   TSH 1.460 02/16/2013       Assessment & Plan:

## 2013-08-18 NOTE — Patient Instructions (Signed)

## 2013-08-18 NOTE — Assessment & Plan Note (Signed)
I will check her A1C to see if she has developed DM2 

## 2013-08-31 ENCOUNTER — Encounter: Payer: Self-pay | Admitting: Internal Medicine

## 2013-11-22 ENCOUNTER — Encounter (INDEPENDENT_AMBULATORY_CARE_PROVIDER_SITE_OTHER): Payer: Self-pay

## 2013-11-22 ENCOUNTER — Ambulatory Visit (INDEPENDENT_AMBULATORY_CARE_PROVIDER_SITE_OTHER): Payer: No Typology Code available for payment source | Admitting: Family

## 2013-11-22 VITALS — BP 144/64 | HR 81 | Temp 98.7°F | Resp 16 | Ht 65.5 in | Wt 165.0 lb

## 2013-11-22 DIAGNOSIS — R300 Dysuria: Secondary | ICD-10-CM

## 2013-11-22 DIAGNOSIS — J329 Chronic sinusitis, unspecified: Secondary | ICD-10-CM

## 2013-11-22 DIAGNOSIS — N390 Urinary tract infection, site not specified: Secondary | ICD-10-CM

## 2013-11-22 LAB — POCT AMB URINALYSIS WITH MICRO
Bilirubin, UA POCT: NEGATIVE
Glucose, UA POCT: NEGATIVE mg/dL — AB
Ketones, UA POCT: NEGATIVE — AB
Nitrite, UA POCT: POSITIVE — AB
POCT Spec Gravity, UA: 1.02 (ref 1.001–1.035)
POCT Urobilinogen,Urine: 1 mg/dL (ref ?–1)
POCT pH, UA: 6.5 (ref 5–8)

## 2013-11-22 MED ORDER — SULFAMETHOXAZOLE-TRIMETHOPRIM 800-160 MG PO TABS
1.0000 | ORAL_TABLET | Freq: Two times a day (BID) | ORAL | Status: AC
Start: 2013-11-22 — End: 2013-11-25

## 2013-11-22 NOTE — Progress Notes (Signed)
Subjective:       Patient ID: Whitney Dawson is a 53 y.o. female.  Chief Complaint   Patient presents with   . Urinary Tract Infection Symptoms     c/o discomfort while urinating, odour in urinne since 11/17/13. pt also have possible sinusitis.       Urinary Tract Infection Symptoms   This is a new problem. The current episode started in the past 7 days. The problem occurs every urination. The problem has been unchanged. The quality of the pain is described as burning. The pain is at a severity of 0/10. Pain severity now: pain only at the end of urination. There has been no fever. She is sexually active. There is no history of pyelonephritis. Pertinent negatives include no chills, discharge, flank pain, frequency, hematuria, hesitancy, nausea, sweats or urgency. She has tried nothing for the symptoms. The treatment provided no relief. Her past medical history is significant for recurrent UTIs. last UTI 10 years ago       The following portions of the patient's history were reviewed and updated as appropriate: allergies, current medications, past family history, past medical history, past social history, past surgical history and problem list.    Review of Systems   Constitutional: Positive for activity change. Negative for chills.   HENT: Positive for congestion and sinus pressure.    Respiratory: Negative for cough, chest tightness and shortness of breath.    Cardiovascular: Negative for chest pain.   Gastrointestinal: Negative for nausea.   Genitourinary: Positive for dysuria. Negative for hesitancy, urgency, frequency, hematuria, flank pain and difficulty urinating.        + foul smelling urine   Musculoskeletal: Negative for arthralgias and myalgias.   All other systems reviewed and are negative.            Objective:     Physical Exam   Constitutional: She is oriented to person, place, and time. She appears well-developed and well-nourished.   HENT:   Nose: Mucosal edema and rhinorrhea present. Right sinus  exhibits no maxillary sinus tenderness and no frontal sinus tenderness. Left sinus exhibits no maxillary sinus tenderness and no frontal sinus tenderness.   Mouth/Throat: Oropharynx is clear and moist.   Cardiovascular: Normal rate and regular rhythm.    Pulmonary/Chest: Effort normal and breath sounds normal.   Abdominal: Soft.   Neurological: She is alert and oriented to person, place, and time.   Skin: Skin is warm and dry.   Psychiatric: She has a normal mood and affect. Her behavior is normal.     BP 144/64  Pulse 81  Temp 98.7 F (37.1 C) (Oral)  Resp 16  Ht 1.664 m (5' 5.5")  Wt 74.844 kg (165 lb)  BMI 27.03 kg/m2  LMP 11/23/2011        Assessment:       UTI  Sinusitis      Plan:       Bactrim DS BID x 3 days  Send urine for culture  Ibuprofen for fever/pain  Drink lots of fluids

## 2013-11-22 NOTE — Patient Instructions (Signed)
Bactrim DS BID x 3 days  Send urine for culture  Ibuprofen for fever/pain  Drink lots of fluids    For sinusitis: use saline spray to nose 2-3 times daily, humidifier, drink lots of fluids.      Urinary Tract Infections in Women  Urinary tract infections (UTIs) are most often caused by bacteria (germs). These bacteria enter the urinary tract. The bacteria may come from outside the body. Or they may travel from the skin outside therectum or vagina into the urethra. Female anatomy makes it easier for bacteria from the bowelto enter a woman's urinary tract, which is the most common source of UTI. This means women develop UTIs more often than men. Pain in or around the urinary tract is a common UTI symptom. But the only way to know for sure if you have a UTI for the doctor to test your urine. The two tests that may be done are the urinalysis and urine culture.  Three Types of UTIs   Cystitis: A bladder infection (cystitis) is the most common UTI in women. You may have urgent or frequent urination. You may also havepain, burning when you urinate, and bloody urine.   Urethritis: This is an inflamed urethra, which is the tube that carries urine from the bladder to outside the body. You may have lower stomach or back pain. You may also have urgent or frequent urination.   Pyelonephritis: This is a kidney infection. If not treated, it can be serious and damage your kidneys. In severe cases, you may be hospitalized. You may have a fever and upper back pain.  Medications to Treat a UTI  Most UTIs are treated with antibiotics. These kill the bacteria. The length of time you need to take them depends on the type of infection. It may be as short as 3 days. If you have repeated UTIs, a low-dose antibiotic may be needed for several months. Take antibiotics exactly as directed. Don't stop taking them until all of the medication is gone. If you stop taking the antibiotic too soon, the infection may not go away, and you may  develop a resistance to the antibiotic. This can make it much harder to treat.  Lifestyle Changes to Treat and Prevent UTIs  The lifestyle changes below will help get rid of your UTI. They may also help prevent future UTIs.   Drink plenty of fluids. This includes water, juice, or other caffeine-free drinks. Fluids help flush bacteria out of your body.   Empty your bladder. Always empty your bladder when you feel the urge to urinate. And always urinate before going to sleep. Urine that stays in your bladder can lead to infection. Try to urinate before and after sex as well.   Practice good personal hygiene. Wipe yourself from front to back after using the toilet. This helps keep bacteria from getting into the urethra.   Use condoms during sex. These help prevent UTIs caused by sexually transmitted bacteria. Also, avoid using spermicides during sex. These can increase the risk of UTIs. Choose other forms of birth control instead. For women who tend to get UTIs after sex, a low-dose of a preventive antibiotic may be used. Be sure to discuss this option with your health care provider.   Follow up with your health care provider as directed. He or she may test to make sure the infection has cleared. If necessary, additional treatment may be started.   [address], [address], [place], [place] [address]. All  rights reserved. This information is not intended as a substitute for professional medical care. Always follow your healthcare professional's instructions.      Sinusitis [No Abx Tx]    The sinuses are air-filled spaces within the bones of the face. They connect to the inside of the nose. Sinusitis is an inflammation of the tissue lining the sinus cavity. Sinus inflammation can occur during a cold or hay-fever (allergies to pollens and other particles in the air) and cause symptoms of sinus congestion and fullness and perhaps a low-grade fever. This does not require antibiotic treatment.  Home  Care:   Drink plenty of water, hot tea, and other liquids to stay well hydrated. This thins the mucus and promotes sinus drainage.   Apply heat to the painful areas of the face. Use a towel soaked in hot water. Or, stand in the shower and direct the hot spray onto your face. This is a good way to inhale warm water vapor and get heat on your face at the same time. (Cover your mouth and nose with your hands so you can still breathe as you do this.)   Use a vaporizer with products such as Vicks VapoRub (contains menthol) at night. Suck on peppermint, menthol or eucalyptus hard candies during the day.   An expectorant containing guaifenesin (such as Robitussin), helps to thin the mucus and promote drainage from the sinuses.   Over-the-counter decongestants may be used unless a similar medicine was prescribed. Nasal sprays work the fastest. Use one that contains phenylephrine (Neo-synephrine, Sinex and others) or oxymetazoline (Afrin). First blow the nose gently to remove mucus, then apply the drops. Do not use these medicines more often than directed on the label or for more than three days or symptoms may worsen. You may also use tablets containing pseudoephedrine (Sudafed). Many sinus remedies combine ingredients, which may increase side effects. Read the labels or ask the pharmacist for help. NOTE: Persons with high blood pressure should not use decongestants. They can raise blood pressure.   Antihistamines are useful if allergies are a cause of your sinusitis. The mildest one is chlorpheniramine (available without a prescription). The dose for adults is 8-12mg  three times a day. [NOTE: Do not use chlorpheniramine if you have glaucoma or if you are a man with trouble urinating due to an enlarged prostate.] Claritin (loratidine) is an antihistamine that causes less drowsiness and is a good alternative for daytime use.   When allergies are the cause for sinusitis, a saline nasal rinse may give relief. Saline  nasal rinse reduces swelling and clears excess mucus. This allows sinuses to drain. Pre-packaged kits are available at most drug stores. These contain pre-mixed salt packets and an irrigation device.   You may use acetaminophen (Tylenol) or ibuprofen (Motrin, Advil) to control pain, unless another pain medicine was prescribed. [ NOTE: If you have chronic liver or kidney disease or ever had a stomach ulcer, talk with your doctor before using these medicines.] (Aspirin should never be used in anyone under 38 years of age who is ill with a fever. It may cause severe liver damage.)  Follow Up  with your doctor or this facility in one week or as instructed by our staff if not improving.  Get Prompt Medical Attention  if any of the following occur:   Green or yellow drainage from the nose or into the back of the throat (post-nasal drip)   Worsening sinus pain or headache   Stiff neck   Unusual  drowsiness, confusion or not acting like your normal self   Swelling of the forehead or eyelids   Vision problems including blurred or double vision   Fever of 100.102F (38C) or higher, or as directed by your healthcare provider   Seizure   [address], [address] [place], [place] [address]. All rights reserved. This information is not intended as a substitute for professional medical care. Always follow your healthcare professional's instructions.

## 2013-11-24 ENCOUNTER — Telehealth (INDEPENDENT_AMBULATORY_CARE_PROVIDER_SITE_OTHER): Payer: Self-pay

## 2013-11-24 LAB — URINE CULTURE

## 2013-11-24 NOTE — Telephone Encounter (Signed)
Results given.

## 2013-12-22 ENCOUNTER — Ambulatory Visit: Payer: 59 | Admitting: Internal Medicine

## 2013-12-23 ENCOUNTER — Encounter: Payer: Self-pay | Admitting: Internal Medicine

## 2013-12-23 ENCOUNTER — Ambulatory Visit (INDEPENDENT_AMBULATORY_CARE_PROVIDER_SITE_OTHER): Payer: 59 | Admitting: Internal Medicine

## 2013-12-23 VITALS — BP 138/88 | HR 93 | Temp 98.0°F | Resp 16 | Ht 65.0 in | Wt 169.0 lb

## 2013-12-23 DIAGNOSIS — E785 Hyperlipidemia, unspecified: Secondary | ICD-10-CM

## 2013-12-23 DIAGNOSIS — E559 Vitamin D deficiency, unspecified: Secondary | ICD-10-CM

## 2013-12-23 DIAGNOSIS — R7309 Other abnormal glucose: Secondary | ICD-10-CM

## 2013-12-23 DIAGNOSIS — I10 Essential (primary) hypertension: Secondary | ICD-10-CM

## 2013-12-23 NOTE — Progress Notes (Signed)
Pre visit review using our clinic review tool, if applicable. No additional management support is needed unless otherwise documented below in the visit note. 

## 2013-12-23 NOTE — Assessment & Plan Note (Signed)
She is not willing to take a med for this She will cont to work on her lifestyle modifications

## 2013-12-23 NOTE — Progress Notes (Signed)
   Subjective:    Patient ID: Gabrielle Long, female    DOB: 06/22/1961, 53 y.o.   MRN: [id number]  Hypertension This is a chronic problem. The current episode started more than 1 year ago. The problem is unchanged. The problem is controlled. Pertinent negatives include no anxiety, blurred vision, chest pain, headaches, malaise/fatigue, neck pain, orthopnea, palpitations, peripheral edema, PND, shortness of breath or sweats. Past treatments include nothing. The current treatment provides mild improvement. There are no compliance problems.       Review of Systems  Constitutional: Negative.  Negative for fever, chills, malaise/fatigue, diaphoresis, appetite change and fatigue.  HENT: Negative.   Eyes: Negative.  Negative for blurred vision.  Respiratory: Negative.  Negative for cough, choking, chest tightness, shortness of breath, wheezing and stridor.   Cardiovascular: Negative.  Negative for chest pain, palpitations, orthopnea, leg swelling and PND.  Gastrointestinal: Negative.  Negative for nausea, vomiting, abdominal pain, diarrhea, constipation and blood in stool.  Endocrine: Negative.   Genitourinary: Negative.   Musculoskeletal: Negative.  Negative for arthralgias, back pain, myalgias, neck pain and neck stiffness.  Skin: Negative.  Negative for pallor and rash.  Allergic/Immunologic: Negative.   Neurological: Negative.  Negative for dizziness and headaches.  Hematological: Negative.  Negative for adenopathy. Does not bruise/bleed easily.  Psychiatric/Behavioral: Negative.        Objective:   Physical Exam  Vitals reviewed. Constitutional: She is oriented to person, place, and time. She appears well-developed and well-nourished. No distress.  HENT:  Head: Normocephalic and atraumatic.  Mouth/Throat: Oropharynx is clear and moist. No oropharyngeal exudate.  Eyes: Conjunctivae are normal. Right eye exhibits no discharge. Left eye exhibits no discharge. No scleral icterus.  Neck:  Normal range of motion. Neck supple. No JVD present. No tracheal deviation present. No thyromegaly present.  Cardiovascular: Normal rate, regular rhythm, normal heart sounds and intact distal pulses.  Exam reveals no gallop and no friction rub.   No murmur heard. Pulmonary/Chest: Effort normal and breath sounds normal. No stridor. No respiratory distress. She has no wheezes. She has no rales. She exhibits no tenderness.  Abdominal: Soft. Bowel sounds are normal. She exhibits no distension and no mass. There is no tenderness. There is no rebound and no guarding.  Musculoskeletal: Normal range of motion. She exhibits no edema.  Lymphadenopathy:    She has no cervical adenopathy.  Neurological: She is oriented to person, place, and time.  Skin: Skin is warm and dry. No rash noted. She is not diaphoretic. No erythema. No pallor.  Psychiatric: She has a normal mood and affect. Her behavior is normal. Judgment and thought content normal.     Lab Results  Component Value Date   WBC 5.1 02/16/2013   HGB 14.0 02/16/2013   HCT 41 02/16/2013   PLT 297 09/13/2010   GLUCOSE 83 09/13/2010   CHOL 250* 02/16/2013   TRIG 96 02/16/2013   HDL 71* 02/16/2013   LDLCALC 160 02/16/2013   ALT 34 02/16/2013   AST 26 02/16/2013   NA 140 02/16/2013   K 3.8 09/13/2010   CL 102 09/13/2010   CREATININE 0.98 09/13/2010   BUN 12 02/16/2013   CO2 26 09/13/2010   TSH 1.460 02/16/2013       Assessment & Plan:

## 2013-12-23 NOTE — Patient Instructions (Signed)

## 2013-12-23 NOTE — Assessment & Plan Note (Signed)
I will check her A1C to see if she has developed DM2 

## 2013-12-23 NOTE — Assessment & Plan Note (Signed)
She will not take a statin I will recheck her FLP

## 2013-12-23 NOTE — Assessment & Plan Note (Signed)
I will recheck her Vit D level today 

## 2013-12-28 LAB — LIPID PANEL
Cholesterol: 277 mg/dL — AB (ref 0–200)
HDL: 70 mg/dL (ref 35–70)
LDL Cholesterol: 186 mg/dL
TRIGLYCERIDES: 104 mg/dL (ref 40–160)

## 2013-12-28 LAB — HEPATIC FUNCTION PANEL
ALT: 26 U/L (ref 7–35)
AST: 19 U/L (ref 13–35)
Alkaline Phosphatase: 64 U/L (ref 25–125)
BILIRUBIN, TOTAL: 0.4 mg/dL

## 2013-12-28 LAB — BASIC METABOLIC PANEL
BUN: 11 mg/dL (ref 4–21)
CREATININE: 1 mg/dL (ref 0.5–1.1)
POTASSIUM: 4.6 mmol/L (ref 3.4–5.3)
SODIUM: 140 mmol/L (ref 137–147)

## 2013-12-28 LAB — HEMOGLOBIN A1C: Hgb A1c MFr Bld: 6 % (ref 4.0–6.0)

## 2014-02-01 ENCOUNTER — Encounter: Payer: Self-pay | Admitting: Internal Medicine

## 2014-04-22 ENCOUNTER — Ambulatory Visit: Payer: 59 | Admitting: Internal Medicine

## 2014-04-26 ENCOUNTER — Ambulatory Visit (INDEPENDENT_AMBULATORY_CARE_PROVIDER_SITE_OTHER): Payer: 59 | Admitting: Internal Medicine

## 2014-04-26 ENCOUNTER — Encounter: Payer: Self-pay | Admitting: Internal Medicine

## 2014-04-26 VITALS — BP 120/80 | HR 83 | Temp 97.7°F | Resp 16 | Ht 65.0 in | Wt 169.0 lb

## 2014-04-26 DIAGNOSIS — J45909 Unspecified asthma, uncomplicated: Secondary | ICD-10-CM

## 2014-04-26 DIAGNOSIS — R739 Hyperglycemia, unspecified: Secondary | ICD-10-CM

## 2014-04-26 DIAGNOSIS — R7309 Other abnormal glucose: Secondary | ICD-10-CM

## 2014-04-26 DIAGNOSIS — E785 Hyperlipidemia, unspecified: Secondary | ICD-10-CM

## 2014-04-26 DIAGNOSIS — IMO0002 Reserved for concepts with insufficient information to code with codable children: Secondary | ICD-10-CM

## 2014-04-26 DIAGNOSIS — I10 Essential (primary) hypertension: Secondary | ICD-10-CM

## 2014-04-26 NOTE — Progress Notes (Signed)
   Subjective:    Patient ID: Gabrielle Long, female    DOB: 04-18-1961, 53 y.o.   MRN: [id number]  Hypertension This is a chronic problem. The current episode started more than 1 year ago. The problem has been gradually improving since onset. The problem is controlled. Pertinent negatives include no anxiety, blurred vision, chest pain, headaches, malaise/fatigue, neck pain, orthopnea, palpitations, peripheral edema, PND, shortness of breath or sweats. Past treatments include nothing. The current treatment provides significant improvement. There are no compliance problems.       Review of Systems  Constitutional: Negative.  Negative for fever, chills, malaise/fatigue, diaphoresis, appetite change and fatigue.  HENT: Negative.   Eyes: Negative.  Negative for blurred vision.  Respiratory: Negative.  Negative for cough, choking, chest tightness, shortness of breath and stridor.   Cardiovascular: Negative.  Negative for chest pain, palpitations, orthopnea, leg swelling and PND.  Gastrointestinal: Negative.  Negative for nausea, vomiting, abdominal pain, diarrhea, constipation and blood in stool.  Endocrine: Negative.   Genitourinary: Negative.   Musculoskeletal: Negative for arthralgias, back pain, joint swelling, myalgias, neck pain and neck stiffness.  Skin: Negative.  Negative for rash.  Allergic/Immunologic: Negative.   Neurological: Negative.  Negative for dizziness and headaches.  Hematological: Negative.  Negative for adenopathy. Does not bruise/bleed easily.  Psychiatric/Behavioral: Negative.        Objective:   Physical Exam  Vitals reviewed. Constitutional: She is oriented to person, place, and time. She appears well-developed and well-nourished. No distress.  HENT:  Head: Normocephalic and atraumatic.  Mouth/Throat: Oropharynx is clear and moist. No oropharyngeal exudate.  Eyes: Conjunctivae are normal. Right eye exhibits no discharge. Left eye exhibits no discharge. No  scleral icterus.  Neck: Normal range of motion. Neck supple. No JVD present. No tracheal deviation present. No thyromegaly present.  Cardiovascular: Normal rate, regular rhythm, normal heart sounds and intact distal pulses.  Exam reveals no gallop and no friction rub.   No murmur heard. Pulmonary/Chest: Effort normal and breath sounds normal. No stridor. No respiratory distress. She has no wheezes. She has no rales. She exhibits no tenderness.  Abdominal: Soft. Bowel sounds are normal. She exhibits no distension and no mass. There is no tenderness. There is no rebound and no guarding.  Musculoskeletal: Normal range of motion. She exhibits no edema and no tenderness.  Lymphadenopathy:    She has no cervical adenopathy.  Neurological: She is oriented to person, place, and time.  Skin: Skin is warm and dry. No rash noted. She is not diaphoretic. No erythema. No pallor.  Psychiatric: She has a normal mood and affect. Her behavior is normal. Judgment and thought content normal.    Lab Results  Component Value Date   WBC 5.1 02/16/2013   HGB 14.0 02/16/2013   HCT 41 02/16/2013   PLT 297 09/13/2010   GLUCOSE 83 09/13/2010   CHOL 277* 12/28/2013   TRIG 104 12/28/2013   HDL 70 12/28/2013   LDLCALC 675 12/28/2013   ALT 26 12/28/2013   AST 19 12/28/2013   NA 140 12/28/2013   K 4.6 12/28/2013   CL 102 09/13/2010   CREATININE 1.0 12/28/2013   BUN 11 12/28/2013   CO2 26 09/13/2010   TSH 1.460 02/16/2013   HGBA1C 6.0 12/28/2013        Assessment & Plan:

## 2014-04-26 NOTE — Patient Instructions (Signed)

## 2014-04-26 NOTE — Progress Notes (Signed)
Pre visit review using our clinic review tool, if applicable. No additional management support is needed unless otherwise documented below in the visit note. 

## 2014-04-28 NOTE — Assessment & Plan Note (Signed)
Her BP is well controlled with lifestyle modifications 

## 2014-04-28 NOTE — Assessment & Plan Note (Signed)
She is not ready to start a statin

## 2014-10-27 ENCOUNTER — Ambulatory Visit: Payer: 59 | Admitting: Internal Medicine

## 2014-11-02 ENCOUNTER — Encounter: Payer: Self-pay | Admitting: Internal Medicine

## 2014-11-02 ENCOUNTER — Ambulatory Visit (INDEPENDENT_AMBULATORY_CARE_PROVIDER_SITE_OTHER): Payer: 59 | Admitting: Internal Medicine

## 2014-11-02 VITALS — BP 134/88 | HR 80 | Temp 97.9°F | Resp 16 | Ht 65.0 in | Wt 165.0 lb

## 2014-11-02 DIAGNOSIS — E559 Vitamin D deficiency, unspecified: Secondary | ICD-10-CM

## 2014-11-02 DIAGNOSIS — R739 Hyperglycemia, unspecified: Secondary | ICD-10-CM

## 2014-11-02 DIAGNOSIS — E785 Hyperlipidemia, unspecified: Secondary | ICD-10-CM

## 2014-11-02 DIAGNOSIS — I10 Essential (primary) hypertension: Secondary | ICD-10-CM

## 2014-11-02 LAB — TSH: TSH: 1.06 u[IU]/mL (ref 0.41–5.90)

## 2014-11-02 LAB — LIPID PANEL
Cholesterol: 254 mg/dL — AB (ref 0–200)
HDL: 80 mg/dL — AB (ref 35–70)
LDL Cholesterol: 160 mg/dL
LDl/HDL Ratio: 3.2
TRIGLYCERIDES: 69 mg/dL (ref 40–160)

## 2014-11-02 LAB — BASIC METABOLIC PANEL
BUN: 11 mg/dL (ref 4–21)
Creatinine: 0.9 mg/dL (ref 0.5–1.1)
GLUCOSE: 91 mg/dL
Potassium: 4.2 mmol/L (ref 3.4–5.3)
Sodium: 141 mmol/L (ref 137–147)

## 2014-11-02 LAB — HEMOGLOBIN A1C: HEMOGLOBIN A1C: 5.8 % (ref 4.0–6.0)

## 2014-11-02 LAB — HEPATIC FUNCTION PANEL
ALK PHOS: 71 U/L (ref 25–125)
ALT: 20 U/L (ref 7–35)
AST: 18 U/L (ref 13–35)
Bilirubin, Total: 0.5 mg/dL

## 2014-11-02 NOTE — Patient Instructions (Signed)

## 2014-11-02 NOTE — Assessment & Plan Note (Signed)
She is doing well with lifestyle modifications Will recheck her A1C and will treat if indicated

## 2014-11-02 NOTE — Assessment & Plan Note (Signed)
She continues to take the supplement Will recheck her level today

## 2014-11-02 NOTE — Assessment & Plan Note (Signed)
Her BP is well controlled I will monitor her lytes and renal function today 

## 2014-11-02 NOTE — Progress Notes (Signed)
   Subjective:    Patient ID: Gabrielle Long, female    DOB: 01/28/1961, 53 y.o.   MRN: [id number]  Hyperlipidemia This is a chronic problem. The current episode started more than 1 year ago. The problem is uncontrolled. Recent lipid tests were reviewed and are variable. She has no history of chronic renal disease, diabetes, hypothyroidism, liver disease, obesity or nephrotic syndrome. There are no known factors aggravating her hyperlipidemia. Pertinent negatives include no chest pain, focal sensory loss, focal weakness, leg pain, myalgias or shortness of breath. She is currently on no antihyperlipidemic treatment. The current treatment provides mild improvement of lipids. There are no compliance problems.       Review of Systems  Constitutional: Negative.  Negative for fever, chills, diaphoresis, appetite change and fatigue.  HENT: Negative.   Eyes: Negative.   Respiratory: Negative.  Negative for shortness of breath.   Cardiovascular: Negative.  Negative for chest pain and leg swelling.  Gastrointestinal: Negative.  Negative for nausea, vomiting, abdominal pain, diarrhea, constipation and blood in stool.  Endocrine: Negative.  Negative for polydipsia, polyphagia and polyuria.  Genitourinary: Negative.   Musculoskeletal: Negative.  Negative for myalgias and arthralgias.  Skin: Negative.  Negative for rash.  Allergic/Immunologic: Negative.   Neurological: Negative.  Negative for focal weakness.  Hematological: Negative.  Negative for adenopathy. Does not bruise/bleed easily.  Psychiatric/Behavioral: Negative.        Objective:   Physical Exam  Constitutional: She is oriented to person, place, and time. She appears well-developed and well-nourished.  HENT:  Head: Normocephalic and atraumatic.  Mouth/Throat: Oropharynx is clear and moist. No oropharyngeal exudate.  Eyes: Conjunctivae are normal. Right eye exhibits no discharge. Left eye exhibits no discharge. No scleral icterus.  Neck:  Normal range of motion. Neck supple. No JVD present. No tracheal deviation present. No thyromegaly present.  Cardiovascular: Normal rate, regular rhythm, normal heart sounds and intact distal pulses.  Exam reveals no gallop and no friction rub.   No murmur heard. Pulmonary/Chest: Effort normal and breath sounds normal. No stridor. No respiratory distress. She has no wheezes. She has no rales. She exhibits no tenderness.  Abdominal: Soft. Bowel sounds are normal. She exhibits no distension and no mass. There is no tenderness. There is no rebound and no guarding.  Musculoskeletal: Normal range of motion. She exhibits no edema or tenderness.  Lymphadenopathy:    She has no cervical adenopathy.  Neurological: She is oriented to person, place, and time.  Skin: Skin is warm and dry. No rash noted. She is not diaphoretic. No erythema. No pallor.  Vitals reviewed.    Lab Results  Component Value Date   WBC 5.1 02/16/2013   HGB 14.0 02/16/2013   HCT 41 02/16/2013   PLT 297 09/13/2010   GLUCOSE 83 09/13/2010   CHOL 277* 12/28/2013   TRIG 104 12/28/2013   HDL 70 12/28/2013   LDLCALC 186 12/28/2013   ALT 26 12/28/2013   AST 19 12/28/2013   NA 140 12/28/2013   K 4.6 12/28/2013   CL 102 09/13/2010   CREATININE 1.0 12/28/2013   BUN 11 12/28/2013   CO2 26 09/13/2010   TSH 1.460 02/16/2013   HGBA1C 6.0 12/28/2013       Assessment & Plan:

## 2014-11-02 NOTE — Assessment & Plan Note (Signed)
She does not want to start a statin and I don't think she has any compelling indications to be on one Will recheck her FLP at her request

## 2014-11-03 ENCOUNTER — Telehealth: Payer: Self-pay | Admitting: Internal Medicine

## 2014-11-03 NOTE — Telephone Encounter (Signed)
emmi emailed °

## 2014-12-13 ENCOUNTER — Encounter: Payer: Self-pay | Admitting: Internal Medicine

## 2015-03-17 NOTE — Telephone Encounter (Signed)
error:[id number] ° °

## 2015-05-03 ENCOUNTER — Encounter: Payer: Self-pay | Admitting: Internal Medicine

## 2015-05-03 ENCOUNTER — Ambulatory Visit (INDEPENDENT_AMBULATORY_CARE_PROVIDER_SITE_OTHER): Payer: 59 | Admitting: Internal Medicine

## 2015-05-03 VITALS — BP 122/74 | HR 85 | Temp 97.8°F | Resp 16 | Ht 65.0 in | Wt 165.2 lb

## 2015-05-03 DIAGNOSIS — R739 Hyperglycemia, unspecified: Secondary | ICD-10-CM

## 2015-05-03 DIAGNOSIS — I10 Essential (primary) hypertension: Secondary | ICD-10-CM

## 2015-05-03 DIAGNOSIS — M159 Polyosteoarthritis, unspecified: Secondary | ICD-10-CM

## 2015-05-03 DIAGNOSIS — Z7189 Other specified counseling: Secondary | ICD-10-CM

## 2015-05-03 DIAGNOSIS — M150 Primary generalized (osteo)arthritis: Secondary | ICD-10-CM

## 2015-05-03 DIAGNOSIS — E785 Hyperlipidemia, unspecified: Secondary | ICD-10-CM | POA: Diagnosis not present

## 2015-05-03 DIAGNOSIS — Z7184 Encounter for health counseling related to travel: Secondary | ICD-10-CM | POA: Insufficient documentation

## 2015-05-03 LAB — LIPID PANEL
Cholesterol: 249 mg/dL — AB (ref 0–200)
HDL: 74 mg/dL — AB (ref 35–70)
LDL Cholesterol: 161 mg/dL
Triglycerides: 70 mg/dL (ref 40–160)

## 2015-05-03 LAB — BASIC METABOLIC PANEL
BUN: 8 mg/dL (ref 4–21)
Creatinine: 0.9 mg/dL (ref 0.5–1.1)
Glucose: 99 mg/dL
Potassium: 4.4 mmol/L (ref 3.4–5.3)
SODIUM: 143 mmol/L (ref 137–147)

## 2015-05-03 LAB — HEPATIC FUNCTION PANEL
ALK PHOS: 70 U/L (ref 25–125)
ALT: 23 U/L (ref 7–35)
AST: 19 U/L (ref 13–35)
BILIRUBIN, TOTAL: 0.4 mg/dL

## 2015-05-03 LAB — TSH: TSH: 1.2 u[IU]/mL (ref 0.41–5.90)

## 2015-05-03 LAB — CBC AND DIFFERENTIAL
HCT: 37 % (ref 36–46)
Hemoglobin: 13.3 g/dL (ref 12.0–16.0)
Neutrophils Absolute: 2 /uL
Platelets: 301 10*3/uL (ref 150–399)
WBC: 4.5 10^3/mL

## 2015-05-03 LAB — HEMOGLOBIN A1C: Hgb A1c MFr Bld: 5.7 % (ref 4.0–6.0)

## 2015-05-03 NOTE — Patient Instructions (Signed)

## 2015-05-03 NOTE — Progress Notes (Signed)
Subjective:  Patient ID: Gabrielle Long, female    DOB: 03-Mar-1961  Age: 54 y.o. MRN: [id number]  CC: Hypertension   HPI Gabrielle Long presents for hypertension and high cholesterol level, she feels well and offers no complaints.  Outpatient Prescriptions Prior to Visit  Medication Sig Dispense Refill  . Cholecalciferol (VITAMIN D3) 5000 UNITS CAPS Take by mouth.      . Misc Natural Products (MENOPAUTONIC PO) Take by mouth.    . mometasone (NASONEX) 50 MCG/ACT nasal spray Place 4 sprays into the nose daily. 17 g 12  . niacin 500 MG tablet Take 500 mg by mouth at bedtime.    . Omega-3 Fatty Acids (SALMON OIL PO) Take by mouth.    Marland Kitchen OVER THE COUNTER MEDICATION Vitamin B7    . vitamin E 100 UNIT capsule Take 100 Units by mouth 3 (three) times daily.     Marland Kitchen BLACK CURRANT SEED OIL PO Take by mouth. 1200 mg     No facility-administered medications prior to visit.    ROS Review of Systems  Constitutional: Negative.  Negative for fever, chills, diaphoresis, appetite change and fatigue.  HENT: Negative.  Negative for trouble swallowing and voice change.   Eyes: Negative.   Respiratory: Negative.  Negative for cough, choking, chest tightness, shortness of breath and stridor.   Cardiovascular: Negative.  Negative for chest pain, palpitations and leg swelling.  Gastrointestinal: Negative.  Negative for nausea, vomiting, abdominal pain, diarrhea, constipation and blood in stool.  Endocrine: Negative.   Genitourinary: Negative.   Musculoskeletal: Negative.  Negative for myalgias, back pain, joint swelling and arthralgias.  Skin: Negative.   Allergic/Immunologic: Negative.   Neurological: Negative.  Negative for dizziness, tremors, syncope, facial asymmetry, light-headedness, numbness and headaches.  Hematological: Negative.  Negative for adenopathy. Does not bruise/bleed easily.  Psychiatric/Behavioral: Negative.     Objective:  BP 122/74 mmHg  Pulse 85  Temp(Src) 97.8 F (36.6 C)  (Oral)  Resp 16  Ht  (1.651 m)  Wt 165 lb 4 oz (74.957 kg)  BMI 27.50 kg/m2  SpO2 99%  BP Readings from Last 3 Encounters:  05/03/15 122/74  11/02/14 134/88  04/26/14 120/80    Wt Readings from Last 3 Encounters:  05/03/15 165 lb 4 oz (74.957 kg)  11/02/14 165 lb (74.844 kg)  04/26/14 169 lb (76.658 kg)    Physical Exam  Constitutional: She is oriented to person, place, and time. She appears well-developed and well-nourished. No distress.  HENT:  Head: Normocephalic and atraumatic.  Mouth/Throat: Oropharynx is clear and moist. No oropharyngeal exudate.  Eyes: Conjunctivae are normal. Right eye exhibits no discharge. Left eye exhibits no discharge. No scleral icterus.  Neck: Normal range of motion. Neck supple. No JVD present. No tracheal deviation present. No thyromegaly present.  Cardiovascular: Normal rate, regular rhythm, normal heart sounds and intact distal pulses.  Exam reveals no gallop and no friction rub.   No murmur heard. Pulmonary/Chest: Effort normal and breath sounds normal. No stridor. No respiratory distress. She has no wheezes. She has no rales. She exhibits no tenderness.  Abdominal: Soft. Bowel sounds are normal. She exhibits no distension and no mass. There is no tenderness. There is no rebound and no guarding.  Musculoskeletal: Normal range of motion. She exhibits no edema or tenderness.  Lymphadenopathy:    She has no cervical adenopathy.  Neurological: She is oriented to person, place, and time.  Skin: Skin is warm and dry. No rash noted. She is not diaphoretic.  No erythema. No pallor.  Psychiatric: She has a normal mood and affect. Her behavior is normal. Judgment and thought content normal.  Vitals reviewed.   Lab Results  Component Value Date   WBC 5.1 02/16/2013   HGB 14.0 02/16/2013   HCT 41 02/16/2013   PLT 297 09/13/2010   GLUCOSE 83 09/13/2010   CHOL 254* 11/02/2014   TRIG 69 11/02/2014   HDL 80* 11/02/2014   LDLCALC 160 11/02/2014     ALT 20 11/02/2014   AST 18 11/02/2014   NA 141 11/02/2014   K 4.2 11/02/2014   CL 102 09/13/2010   CREATININE 0.9 11/02/2014   BUN 11 11/02/2014   CO2 26 09/13/2010   TSH 1.06 11/02/2014   HGBA1C 5.8 11/02/2014    No results found.  Assessment & Plan:   Gabrielle Long was seen today for hypertension.  Diagnoses and all orders for this visit:  Essential hypertension - her BP is well controlled Orders: -     Comprehensive metabolic panel -     CBC with Differential/Platelet  Primary osteoarthritis involving multiple joints Orders: -     Sedimentation rate  Hyperglycemia - will monitor for the development of DM2 Orders: -     Hemoglobin A1c  Hyperlipidemia with target LDL less than 160 - she does not want to take a statin, will monitor her FLP Orders: -     Lipid panel -     Comprehensive metabolic panel -     CK -     TSH  Travel advice encounter - she is going to [place] in 3 months Orders: -     Ambulatory referral to Infectious Disease  I have discontinued Gabrielle Long's BLACK CURRANT SEED OIL PO. I am also having her maintain her vitamin E, Vitamin D3, OVER THE COUNTER MEDICATION, mometasone, niacin, Omega-3 Fatty Acids (SALMON OIL PO), and Misc Natural Products (MENOPAUTONIC PO).  No orders of the defined types were placed in this encounter.     Follow-up: Return in about 6 months (around 11/02/2015).  Sanda Linger, MD

## 2015-05-03 NOTE — Progress Notes (Signed)
Pre visit review using our clinic review tool, if applicable. No additional management support is needed unless otherwise documented below in the visit note. 

## 2015-05-05 ENCOUNTER — Encounter: Payer: Self-pay | Admitting: Internal Medicine

## 2015-05-16 NOTE — Addendum Note (Signed)
Addended by: Etta Grandchild on: 05/16/2015 07:51 AM   Modules accepted: Kipp Brood

## 2015-05-27 ENCOUNTER — Ambulatory Visit (INDEPENDENT_AMBULATORY_CARE_PROVIDER_SITE_OTHER): Payer: 59 | Admitting: Internal Medicine

## 2015-05-27 DIAGNOSIS — Z7184 Encounter for health counseling related to travel: Secondary | ICD-10-CM

## 2015-05-27 DIAGNOSIS — Z789 Other specified health status: Secondary | ICD-10-CM

## 2015-05-27 DIAGNOSIS — Z23 Encounter for immunization: Secondary | ICD-10-CM | POA: Diagnosis not present

## 2015-05-27 DIAGNOSIS — Z7189 Other specified counseling: Secondary | ICD-10-CM | POA: Diagnosis not present

## 2015-05-27 MED ORDER — CIPROFLOXACIN HCL 500 MG PO TABS: 500.0000 mg | ORAL_TABLET | Freq: Two times a day (BID) | ORAL | Status: DC

## 2015-05-27 MED ORDER — TYPHOID VACCINE PO CPDR: 1.0000 | DELAYED_RELEASE_CAPSULE | ORAL | Status: DC

## 2015-05-27 MED ORDER — ATOVAQUONE-PROGUANIL HCL 250-100 MG PO TABS: 1.0000 | ORAL_TABLET | Freq: Every day | ORAL | Status: DC

## 2015-05-27 NOTE — Patient Instructions (Signed)
[facility]                [address], [address]                   [place], [place] [id number]                      Phone: [phone]                        Fax: [phone]   Planned departure date: July 28, 2015          Planned return date: 9 days Countries of travel: [place]   Guidelines for the Prevention & Treatment of Traveler's Diarrhea  Prevention: "Boil it, Peel it, Adriana SimasCook it, or Forget it"   the fewer chances -> lower risk: try to stick to food & water precautions as much as possible"   If it's "piping hot"; it is probably okay, if not, it may not be   Treatment   1) You should always take care to drink lots of fluids in order to avoid dehydration   2) You should bring medications with you in case you come down with a case of diarrhea   3) OTC = bring pepto-bismol - can take with initial abdominal symptoms;                    Imodium - can help slow down your intestinal tract, can help relief cramps                    and diarrhea, can take if no bloody diarrhea  Use ciprofloxacin if needed for traveler's diarrhea  Guidelines for the Prevention of Malaria  Avoidance:  -fewer mosquito bites = lower risk. Mosquitos can bite at night as well as daytime  -cover up (long sleeve clothing), mosquito nets, screens  -Insect repellent for your skin ( DEET containing lotion > 20%): for clothes ( permethrin spray)   On Sep 6, start malarone, daily dose starting 1-2 days before entering endemic area, ending 7 days after leaving area for malaria prevention.   Immunizations received today: Hepatitis A series, Typhoid (oral) and Yellow Fever  Future immunizations, if indicated Hepatitis A series   Prior to travel:  1) Be sure to pick up appropriate prescriptions, including medicine you take daily. Do not expect to be able to fill your prescriptions abroad.  2) Strongly consider obtaining traveler's insurance, including  emergency evacuation insurance. Most plans in the [place] do not cover participants abroad. (see below for resources)  3) Register at the appropriate [place] embassy or consulate with travel dates so they are aware of your presence in-country and for helpful advice during travel using the [employer] Traveler Enrollment Program (STEP, GuyGalaxy.sihttps://step.state.gov/step).  4) Leave contact information with a relative or friend.  5) Keep a Corporate treasurerphotocopy passport, credit cards in case they become lost or stolen  6) Inform your credit card company that you will be travelling abroad   During travel:  1) If you become ill and need medical advice, the [place] [employer]assy website of the country you are traveling in general provides a list of English speaking doctors.  We are also available on MyChart for remote consultation if you register prior to travel. 2) Avoid motorcycles or scooters when at all possible. Traffic laws in many countries are lax and accidents  occur frequently.  3) Do not take any unnecessary risks that you wouldn't do at home.   Resources:  -Country specific information: BlindResource.ca or GreenNylon.com.cy  -Press photographer (DEET, mosquito nets): [employer], [employer] store, [employer], Gall[employer] insurance options: gatewayplans.com; http://clayton-rivera.info/; travelguard.com or Good Pilgrim's Pride, gninsurance.com or info@gninsurance .com, [id number].   Post Travel:  If you return from your trip ill, call your primary care doctor or our travel clinic @ [phone].   Enjoy your trip and know that with proper pre-travel preparation, most people have an enjoyable and uninterrupted trip!

## 2015-05-27 NOTE — Progress Notes (Signed)
Subjective:   Jolinda Gow is a 54 y.o. female who presents to the Infectious Disease clinic for travel consultation. Planned departure date: July 28, 2015          Planned return date: 9 days Countries of travel: [place] Areas in country: rural   Accommodations: hotel Purpose of travel: missionary work Prior travel out of [place]: yes     Objective:   Medications:reviewed    Assessment:    No contraindications to travel. none     Plan:    Issues discussed: environmental concerns, future shots, insect-borne illnesses, malaria, motion sickness, MVA safety, rabies, safe food/water, traveler's diarrhea, website/handouts for more information, what to do if ill upon return, what to do if ill while there and Yellow Fever. Immunizations recommended: Hepatitis A series, Typhoid (oral) and Yellow Fever. Malaria prophylaxis: malarone, daily dose starting 1-2 days before entering endemic area, ending 7 days after leaving area Traveler's diarrhea prophylaxis: ciprofloxacin. Total duration of visit: 1 Hour. Total time spent on education, counseling, coordination of care: 30 Minutes.

## 2015-06-01 ENCOUNTER — Encounter: Payer: Self-pay | Admitting: Internal Medicine

## 2015-06-28 LAB — HM MAMMOGRAPHY

## 2015-11-02 ENCOUNTER — Ambulatory Visit (INDEPENDENT_AMBULATORY_CARE_PROVIDER_SITE_OTHER): Payer: 59 | Admitting: Internal Medicine

## 2015-11-02 ENCOUNTER — Encounter: Payer: Self-pay | Admitting: Internal Medicine

## 2015-11-02 VITALS — BP 128/88 | HR 78 | Temp 97.4°F | Resp 16 | Ht 65.0 in | Wt 170.0 lb

## 2015-11-02 DIAGNOSIS — H6980 Other specified disorders of Eustachian tube, unspecified ear: Secondary | ICD-10-CM | POA: Insufficient documentation

## 2015-11-02 DIAGNOSIS — H6981 Other specified disorders of Eustachian tube, right ear: Secondary | ICD-10-CM | POA: Diagnosis not present

## 2015-11-02 DIAGNOSIS — E559 Vitamin D deficiency, unspecified: Secondary | ICD-10-CM | POA: Diagnosis not present

## 2015-11-02 DIAGNOSIS — E785 Hyperlipidemia, unspecified: Secondary | ICD-10-CM | POA: Diagnosis not present

## 2015-11-02 DIAGNOSIS — R739 Hyperglycemia, unspecified: Secondary | ICD-10-CM

## 2015-11-02 DIAGNOSIS — J301 Allergic rhinitis due to pollen: Secondary | ICD-10-CM

## 2015-11-02 DIAGNOSIS — I10 Essential (primary) hypertension: Secondary | ICD-10-CM

## 2015-11-02 MED ORDER — METHYLPREDNISOLONE ACETATE 80 MG/ML IJ SUSP
120.0000 mg | Freq: Once | INTRAMUSCULAR | Status: AC
  Administered 2015-11-02: 120 mg via INTRAMUSCULAR

## 2015-11-02 MED ORDER — FLUTICASONE PROPIONATE 50 MCG/ACT NA SUSP: 2.0000 | Freq: Every day | NASAL | Status: DC

## 2015-11-02 NOTE — Progress Notes (Signed)
Subjective:  Patient ID: Gabrielle Long, female    DOB: [birth year]  Age: 54 y.o. MRN: [id number]  CC: Hyperlipidemia and Hypertension   HPI Gabrielle Long presents for follow-up on high cholesterol and hypertension. She also tells me that a few weeks ago she developed an upper respiratory infection was treated at an urgent care center with Augmentin. Her only persistent symptoms that she has popping and pressure in both ears. She complains of persistent nasal congestion, sneezing and runny nose.  Outpatient Prescriptions Prior to Visit  Medication Sig Dispense Refill  . Cholecalciferol (VITAMIN D3) 5000 UNITS CAPS Take by mouth.      . Misc Natural Products (MENOPAUTONIC PO) Take by mouth.    . niacin 500 MG tablet Take 500 mg by mouth at bedtime.    . Omega-3 Fatty Acids (SALMON OIL PO) Take by mouth.    Marland Kitchen OVER THE COUNTER MEDICATION Vitamin B7    . vitamin E 100 UNIT capsule Take 100 Units by mouth 3 (three) times daily.     . mometasone (NASONEX) 50 MCG/ACT nasal spray Place 4 sprays into the nose daily. 17 g 12  . typhoid (VIVOTIF) DR capsule Take 1 capsule by mouth every other day. Keep refrigerated 4 capsule 0  . atovaquone-proguanil (MALARONE) 250-100 MG TABS Take 1 tablet by mouth daily. Start 2 days prior to travel to malaria area, throughout travel and for 7 days upon return. 18 tablet 0  . ciprofloxacin (CIPRO) 500 MG tablet Take 1 tablet (500 mg total) by mouth 2 (two) times daily. Take 1-2 days until diarrhea resolves 6 tablet 0   No facility-administered medications prior to visit.    ROS Review of Systems  Constitutional: Negative.  Negative for fever, chills, diaphoresis, activity change, appetite change, fatigue and unexpected weight change.  HENT: Positive for congestion, ear pain, postnasal drip, rhinorrhea and sneezing. Negative for ear discharge, facial swelling, sinus pressure, sore throat, tinnitus, trouble swallowing and voice change.   Eyes: Negative.     Respiratory: Negative.  Negative for cough, choking, chest tightness, shortness of breath and stridor.   Cardiovascular: Negative.  Negative for chest pain, palpitations and leg swelling.  Gastrointestinal: Negative.  Negative for nausea, vomiting, abdominal pain, diarrhea, constipation and blood in stool.  Endocrine: Negative.   Genitourinary: Negative.   Musculoskeletal: Negative.  Negative for myalgias, back pain, joint swelling and arthralgias.  Skin: Negative.  Negative for color change and rash.  Allergic/Immunologic: Negative.   Neurological: Negative.  Negative for dizziness, tremors, speech difficulty, weakness, light-headedness, numbness and headaches.  Hematological: Negative.  Negative for adenopathy. Does not bruise/bleed easily.  Psychiatric/Behavioral: Negative.     Objective:  BP 128/88 mmHg  Pulse 78  Temp(Src) 97.4 F (36.3 C) (Oral)  Resp 16  Ht  (1.651 m)  Wt 170 lb (77.111 kg)  BMI 28.29 kg/m2  SpO2 97%  BP Readings from Last 3 Encounters:  11/02/15 128/88  05/03/15 122/74  11/02/14 134/88    Wt Readings from Last 3 Encounters:  11/02/15 170 lb (77.111 kg)  05/03/15 165 lb 4 oz (74.957 kg)  11/02/14 165 lb (74.844 kg)    Physical Exam  Constitutional: She is oriented to person, place, and time. She appears well-developed and well-nourished. No distress.  HENT:  Head: Normocephalic and atraumatic.  Right Ear: Hearing, tympanic membrane, external ear and ear canal normal.  Left Ear: Hearing, tympanic membrane, external ear and ear canal normal.  Nose: Mucosal edema present. No rhinorrhea or  sinus tenderness. Right sinus exhibits no maxillary sinus tenderness and no frontal sinus tenderness. Left sinus exhibits no maxillary sinus tenderness and no frontal sinus tenderness.  Mouth/Throat: Oropharynx is clear and moist and mucous membranes are normal. Mucous membranes are not pale, not dry and not cyanotic. No trismus in the jaw. No uvula swelling. No  oropharyngeal exudate, posterior oropharyngeal edema, posterior oropharyngeal erythema or tonsillar abscesses.  Eyes: Right eye exhibits no discharge. Left eye exhibits no discharge. No scleral icterus.  Neck: Normal range of motion. Neck supple. No JVD present. No tracheal deviation present. No thyromegaly present.  Cardiovascular: Normal rate, regular rhythm, normal heart sounds and intact distal pulses.  Exam reveals no gallop and no friction rub.   No murmur heard. Pulmonary/Chest: Effort normal and breath sounds normal. No stridor. No respiratory distress. She has no wheezes. She has no rales. She exhibits no tenderness.  Abdominal: Soft. Bowel sounds are normal. She exhibits no distension and no mass. There is no tenderness. There is no rebound and no guarding.  Musculoskeletal: Normal range of motion. She exhibits no edema or tenderness.  Lymphadenopathy:    She has no cervical adenopathy.  Neurological: She is oriented to person, place, and time.  Skin: Skin is warm and dry. No rash noted. She is not diaphoretic. No erythema. No pallor.  Vitals reviewed.   Lab Results  Component Value Date   WBC 4.5 05/03/2015   HGB 13.3 05/03/2015   HCT 37 05/03/2015   PLT 301 05/03/2015   GLUCOSE 83 09/13/2010   CHOL 249* 05/03/2015   TRIG 70 05/03/2015   HDL 74* 05/03/2015   LDLCALC 161 05/03/2015   ALT 23 05/03/2015   AST 19 05/03/2015   NA 143 05/03/2015   K 4.4 05/03/2015   CL 102 09/13/2010   CREATININE 0.9 05/03/2015   BUN 8 05/03/2015   CO2 26 09/13/2010   TSH 1.20 05/03/2015   HGBA1C 5.7 05/03/2015    No results found.  Assessment & Plan:   [name] was seen today for hyperlipidemia and hypertension.  Diagnoses and all orders for this visit:  Eustachian tube dysfunction, right- she has a moderately severe case of eustachian tube dysfunction, I gave her an injection of Depo-Medrol and she will start Flonase nasal spray. -     fluticasone (FLONASE) 50 MCG/ACT nasal spray;  Place 2 sprays into both nostrils daily. -     methylPREDNISolone acetate (DEPO-MEDROL) injection 120 mg; Inject 1.5 mLs (120 mg total) into the muscle once.  Vitamin D deficiency -     VITAMIN D 25 Hydroxy (Vit-D Deficiency, Fractures)  Hyperlipidemia with target LDL less than 160- I will recheck her lipids, at this time she is not willing to take a statin. -     Lipoprotein Analysis by NMR -     Lipid panel  Essential hypertension- her pressures well controlled -     Comprehensive metabolic panel  Hyperglycemia -     Comprehensive metabolic panel -     Hemoglobin A1c  Allergic rhinitis due to pollen -     fluticasone (FLONASE) 50 MCG/ACT nasal spray; Place 2 sprays into both nostrils daily.  I have discontinued Gabrielle Long's mometasone, atovaquone-proguanil, typhoid, and ciprofloxacin. I am also having her start on fluticasone. Additionally, I am having her maintain her vitamin E, Vitamin D3, OVER THE COUNTER MEDICATION, niacin, Omega-3 Fatty Acids (SALMON OIL PO), and Misc Natural Products (MENOPAUTONIC PO). We administered methylPREDNISolone acetate.  Meds ordered this encounter  Medications  . fluticasone (FLONASE) 50 MCG/ACT nasal spray    Sig: Place 2 sprays into both nostrils daily.    Dispense:  16 g    Refill:  11  . methylPREDNISolone acetate (DEPO-MEDROL) injection 120 mg    Sig:      Follow-up: Return if symptoms worsen or fail to improve.  Sanda Linger, MD

## 2015-11-02 NOTE — Patient Instructions (Signed)

## 2015-11-02 NOTE — Progress Notes (Signed)
Pre visit review using our clinic review tool, if applicable. No additional management support is needed unless otherwise documented below in the visit note. 

## 2015-11-03 LAB — HEPATIC FUNCTION PANEL
ALT: 43 U/L — AB (ref 7–35)
AST: 24 U/L (ref 13–35)
Alkaline Phosphatase: 80 U/L (ref 25–125)
Bilirubin, Total: 0.4 mg/dL

## 2015-11-03 LAB — BASIC METABOLIC PANEL
BUN: 9 mg/dL (ref 4–21)
CREATININE: 0.9 mg/dL (ref 0.5–1.1)
GLUCOSE: 93 mg/dL
POTASSIUM: 4 mmol/L (ref 3.4–5.3)
Sodium: 142 mmol/L (ref 137–147)

## 2015-11-03 LAB — LIPID PANEL
CHOLESTEROL: 249 mg/dL — AB (ref 0–200)
HDL: 59 mg/dL (ref 35–70)
LDL Cholesterol: 166 mg/dL
LDL/HDL RATIO: 4.2
Triglycerides: 119 mg/dL (ref 40–160)

## 2015-11-03 LAB — HEMOGLOBIN A1C: Hemoglobin A1C: 6.1

## 2015-11-11 ENCOUNTER — Other Ambulatory Visit: Payer: Self-pay | Admitting: Internal Medicine

## 2015-11-11 LAB — VITAMIN D 25 HYDROXY (VIT D DEFICIENCY, FRACTURES): VITAMIN D, 25-OH, D3: 38.1

## 2015-12-16 LAB — HM COLONOSCOPY

## 2016-07-25 LAB — HM PAP SMEAR

## 2016-07-25 LAB — HM MAMMOGRAPHY: HM Mammogram: NORMAL (ref 0–4)

## 2016-07-26 LAB — LIPID PANEL
Cholesterol: 264 mg/dL — AB (ref 0–200)
HDL: 74 mg/dL — AB (ref 35–70)
LDL Cholesterol: 167 mg/dL
TRIGLYCERIDES: 113 mg/dL (ref 40–160)

## 2016-07-26 LAB — HEMOGLOBIN A1C: Hemoglobin A1C: 5.4

## 2016-08-28 NOTE — Progress Notes (Unsigned)
Received lipid panel results from [facility].

## 2016-10-23 ENCOUNTER — Encounter: Payer: Self-pay | Admitting: Emergency Medicine

## 2016-10-23 ENCOUNTER — Emergency Department
Admission: EM | Admit: 2016-10-23 | Discharge: 2016-10-23 | Disposition: A | Discharge status: Home / Self Care | Payer: 59 | Attending: Emergency Medicine | Admitting: Emergency Medicine

## 2016-10-23 ENCOUNTER — Emergency Department: Payer: 59

## 2016-10-23 ENCOUNTER — Telehealth: Payer: Self-pay | Admitting: Internal Medicine

## 2016-10-23 DIAGNOSIS — Z79899 Other long term (current) drug therapy: Secondary | ICD-10-CM | POA: Insufficient documentation

## 2016-10-23 DIAGNOSIS — F419 Anxiety disorder, unspecified: Secondary | ICD-10-CM | POA: Diagnosis not present

## 2016-10-23 DIAGNOSIS — J45909 Unspecified asthma, uncomplicated: Secondary | ICD-10-CM | POA: Insufficient documentation

## 2016-10-23 DIAGNOSIS — I10 Essential (primary) hypertension: Secondary | ICD-10-CM | POA: Diagnosis not present

## 2016-10-23 DIAGNOSIS — R519 Headache, unspecified: Secondary | ICD-10-CM

## 2016-10-23 DIAGNOSIS — R51 Headache: Secondary | ICD-10-CM

## 2016-10-23 LAB — COMPREHENSIVE METABOLIC PANEL
ALBUMIN: 4.6 g/dL (ref 3.5–5.0)
ALT: 56 U/L — AB (ref 14–54)
AST: 39 U/L (ref 15–41)
Alkaline Phosphatase: 61 U/L (ref 38–126)
Anion gap: 8 (ref 5–15)
BILIRUBIN TOTAL: 0.5 mg/dL (ref 0.3–1.2)
BUN: 8 mg/dL (ref 6–20)
CHLORIDE: 103 mmol/L (ref 101–111)
CO2: 28 mmol/L (ref 22–32)
CREATININE: 0.84 mg/dL (ref 0.44–1.00)
Calcium: 10 mg/dL (ref 8.9–10.3)
GFR calc Af Amer: >60 mL/min (ref >60)
GLUCOSE: 91 mg/dL (ref 65–99)
POTASSIUM: 4.2 mmol/L (ref 3.5–5.1)
Sodium: 139 mmol/L (ref 135–145)
TOTAL PROTEIN: 8.3 g/dL — AB (ref 6.5–8.1)

## 2016-10-23 LAB — CBC
HEMATOCRIT: 41.7 % (ref 35.0–47.0)
Hemoglobin: 14.3 g/dL (ref 12.0–16.0)
MCH: 29.1 pg (ref 26.0–34.0)
MCHC: 34.3 g/dL (ref 32.0–36.0)
MCV: 84.7 fL (ref 80.0–100.0)
Platelets: 326 10*3/uL (ref 150–440)
RBC: 4.92 MIL/uL (ref 3.80–5.20)
RDW: 14.2 % (ref 11.5–14.5)
WBC: 5 10*3/uL (ref 3.6–11.0)

## 2016-10-23 LAB — TROPONIN I

## 2016-10-23 MED ORDER — HYDROCHLOROTHIAZIDE 25 MG PO TABS: 25.0000 mg | ORAL_TABLET | Freq: Every day | ORAL | 2 refills | Status: DC

## 2016-10-23 NOTE — Telephone Encounter (Signed)
PLEASE NOTE: All timestamps contained within this report are represented as [place] Standard Time. CONFIDENTIALTY NOTICE: This fax transmission is intended only for the addressee. It contains information that is legally privileged, confidential or otherwise protected from use or disclosure. If you are not the intended recipient, you are strictly prohibited from reviewing, disclosing, copying using or disseminating any of this information or taking any action in reliance on or regarding this information. If you have received this fax in error, please notify us immediately by telephone so that we can arrange for its return to us. Phone: [phone], Toll-Free: [phone], Fax: [phone] Page: 1 of 1 Call Id: [id number] [facility] - Client TELEPHONE ADVICE RECORD [facility] Patient Name: Gabrielle Long DOB: 12/17/1960 Initial Comment Caller States- noticed yesterday and then again today, severe achy headache, checked BP and it is 150/110, queasy, out of whack yesterday just off Nurse Assessment Nurse: Leveda AnnaHensel, RN, Aeriel Date/Time (Eastern Time): 10/23/2016 11:57:34 AM Confirm and document reason for call. If symptomatic, describe symptoms. ---Caller states, she has a headache, she is achy her blood pressure is up. She thought she just needed some sleep. She felt that way most of the day. She didn't think it was her blood pressure. She was also queasy. Today when she still had a headache at 9am it was 150/110. It is still the same. Does the patient have any new or worsening symptoms? ---Yes Will a triage be completed? ---Yes Related visit to physician within the last 2 weeks? ---No Does the PT have any chronic conditions? (i.e. diabetes, asthma, etc.) ---Yes List chronic conditions. ---htn, Is the patient pregnant or possibly pregnant? (Ask all females between the ages of 7512-55) ---No Is this a behavioral health or substance abuse call?  ---No Guidelines Guideline Title Affirmed Question Affirmed Notes High Blood Pressure [1] BP # 160 / 100 AND [2] cardiac or neurologic symptoms (e.g., chest pain, difficulty breathing, unsteady gait, blurred vision) Final Disposition User Go to ED Now Hensel, RN, Aeriel Referrals GO TO FACILITY OTHER - SPECIFY Disagree/Comply: Comply

## 2016-10-23 NOTE — ED Triage Notes (Signed)
Patient presents to the ED with high blood pressure and headache since yesterday.  Patient reports she has known htn but has been managing with diet and exercise until yesterday when it spiked. Patient denies dizziness, chest pain, shortness of breath, and weakness.  Patient reports soreness to the base of her neck and shoulders.  Patient is in no obvious distress at this time.

## 2016-10-23 NOTE — ED Provider Notes (Signed)
[facility] Emergency Department Provider Note  Time seen: 2:53 PM  I have reviewed the triage vital signs and the nursing notes.   HISTORY  Chief Complaint Hypertension and Headache    HPI Gabrielle Long is a 55 y.o. female with a past medical history of hypertension, hyperlipidemia, presents the emergency department with a headache and hypertension. According to the patient she had been diagnosed with hypertension, had been on medications in the past post hoping to manage her blood pressure diet and exercise. Patient has been dieting and exercising however over the past 2 days she has noticed she has been having a headache. She took her blood pressure at home nose 150/110. She became concerned so she called her doctor to obtain an appointment, but the nurse recommended that she come to the ER for evaluation. Here the patient states that mild global headache, denies any focal weakness or numbness. Denies any chest pain or shortness breath at any point.  Past Medical History:  Diagnosis Date  . Asthma   . Degenerative joint disease   . Hyperlipidemia   . Hypertension   . Low back pain     Patient Active Problem List   Diagnosis Date Noted  . Eustachian tube dysfunction 11/02/2015  . Hyperglycemia 08/18/2013  . Routine general medical examination at a health care facility 02/16/2013  . Allergic rhinitis 09/12/2010  . Vitamin D deficiency 04/07/2009  . Hyperlipidemia with target LDL less than 160 03/09/2009  . Essential hypertension 03/09/2009  . ASTHMA 03/09/2009  . DJD (degenerative joint disease) 03/09/2009    History reviewed. No pertinent surgical history.  Prior to Admission medications   Medication Sig Start Date End Date Taking? Authorizing Provider  Cholecalciferol (VITAMIN D3) 5000 UNITS CAPS Take by mouth.      Historical Provider, MD  fluticasone (FLONASE) 50 MCG/ACT nasal spray Place 2 sprays into both nostrils daily. 11/02/15   Etta Grandchildhomas L  Jones, MD  Misc Natural Products (MENOPAUTONIC PO) Take by mouth.    Historical Provider, MD  niacin 500 MG tablet Take 500 mg by mouth at bedtime.    Historical Provider, MD  Omega-3 Fatty Acids (SALMON OIL PO) Take by mouth.    Historical Provider, MD  OVER THE COUNTER MEDICATION Vitamin B7    Historical Provider, MD  vitamin E 100 UNIT capsule Take 100 Units by mouth 3 (three) times daily.     Historical Provider, MD    No Known Allergies  Family History  Problem Relation Age of Onset  . Hypertension Father   . Hypertension Sister   . Cancer Maternal Grandmother [id number]    Colon Cancer  . Hypertension Other     Parents and Grandparents  . Kidney disease Other     Grandparent  . Cancer Other     Lung Cancer-Grandparent  . Stroke Neg Hx   . Heart disease Neg Hx   . Early death Neg Hx   . Diabetes Neg Hx     Social History Social History  Substance Use Topics  . Smoking status: Never Smoker  . Smokeless tobacco: Never Used  . Alcohol use Yes     Comment: occasionally    Review of Systems Constitutional: Negative for fever. Cardiovascular: Negative for chest pain. Respiratory: Negative for shortness of breath. Gastrointestinal: Negative for abdominal pain Neurological: Mild headache. Denies focal weakness or numbness. 10-point ROS otherwise negative.  ____________________________________________   PHYSICAL EXAM:  VITAL SIGNS: ED Triage Vitals  Enc Vitals Group  BP 10/23/16 1220 (!) 179/103     Pulse Rate 10/23/16 1220 87     Resp 10/23/16 1220 16     Temp 10/23/16 1220 97.8 F (36.6 C)     Temp src --      SpO2 10/23/16 1220 100 %     Weight 10/23/16 1223 160 lb (72.6 kg)     Height 10/23/16 1223 5\' 5"  (1.651 m)     Head Circumference --      Peak Flow --      Pain Score 10/23/16 1223 3     Pain Loc --      Pain Edu? --      Excl. in GC? --     Constitutional: Alert and oriented. Well appearing and in no distress.Mildly anxious appearing. Eyes:  Normal exam ENT   Head: Normocephalic and atraumatic   Mouth/Throat: Mucous membranes are moist. Cardiovascular: Normal rate, regular rhythm. No murmur Respiratory: Normal respiratory effort without tachypnea nor retractions. Breath sounds are clear Gastrointestinal: Soft and nontender. No distention.  Musculoskeletal: Nontender with normal range of motion in all extremities. Neurologic:  Normal speech and language. No gross focal neurologic deficits. Grip strengths are equal. No pronator drift. Skin:  Skin is warm, dry and intact.  Psychiatric: Mood and affect are normal. Mild anxiety.  ____________________________________________    EKG  EKG reviewed and interpreted by myself shows normal sinus rhythm 82 bpm, narrow QRS, normal axis, normal intervals, no concerning ST changes. Overall normal EKG.  ____________________________________________    RADIOLOGY  CT head negative  ____________________________________________   INITIAL IMPRESSION / ASSESSMENT AND PLAN / ED COURSE  Pertinent labs & imaging results that were available during my care of the patient were reviewed by me and considered in my medical decision making (see chart for details).  Patient presents the emergency department with high blood pressure currently 158/101. Patient is mildly anxious appearing. I suspect anxiety is likely playing into the blood pressure somewhat, however as she has been on medications in the past for blood pressure and I suspect her blood pressure is chronically elevated. We will start the patient on 25 mg hydrochlorothiazide. I will have the patient follow up with her primary care doctor in 1-2 weeks for recheck/reevaluation. Overall the patient appears well him in no distress with a normal physical exam, normal labs reassuring EKG and a normal head CT.  ____________________________________________   FINAL CLINICAL IMPRESSION(S) / ED DIAGNOSES  Headache Hypertension    Minna AntisKevin  Dontavia Brand, MD 10/23/16 1456

## 2016-10-25 ENCOUNTER — Ambulatory Visit: Payer: 59 | Admitting: Internal Medicine

## 2016-11-07 ENCOUNTER — Ambulatory Visit (INDEPENDENT_AMBULATORY_CARE_PROVIDER_SITE_OTHER): Payer: 59 | Admitting: Internal Medicine

## 2016-11-07 ENCOUNTER — Encounter: Payer: Self-pay | Admitting: Internal Medicine

## 2016-11-07 VITALS — BP 122/80 | HR 79 | Temp 97.9°F | Resp 16 | Ht 65.0 in | Wt 162.8 lb

## 2016-11-07 DIAGNOSIS — I10 Essential (primary) hypertension: Secondary | ICD-10-CM | POA: Diagnosis not present

## 2016-11-07 DIAGNOSIS — E785 Hyperlipidemia, unspecified: Secondary | ICD-10-CM

## 2016-11-07 DIAGNOSIS — Z0000 Encounter for general adult medical examination without abnormal findings: Secondary | ICD-10-CM | POA: Diagnosis not present

## 2016-11-07 DIAGNOSIS — R739 Hyperglycemia, unspecified: Secondary | ICD-10-CM | POA: Diagnosis not present

## 2016-11-07 DIAGNOSIS — Z23 Encounter for immunization: Secondary | ICD-10-CM | POA: Diagnosis not present

## 2016-11-07 LAB — HEMOGLOBIN A1C
HEMOGLOBIN A1C: 5.7
Hemoglobin A1C: 5.7

## 2016-11-07 LAB — LIPID PANEL
Cholesterol: 281 mg/dL — AB (ref 0–200)
HDL: 75 mg/dL — AB (ref 35–70)
LDL Cholesterol: 187 mg/dL
Triglycerides: 97 mg/dL (ref 40–160)

## 2016-11-07 LAB — TSH: TSH: 1.14 u[IU]/mL (ref 0.41–5.90)

## 2016-11-07 NOTE — Progress Notes (Signed)
Pre visit review using our clinic review tool, if applicable. No additional management support is needed unless otherwise documented below in the visit note. 

## 2016-11-07 NOTE — Patient Instructions (Signed)

## 2016-11-07 NOTE — Progress Notes (Signed)
Subjective:  Patient ID: Gabrielle Long, female    DOB: 10/23/1961  Age: 55 y.o. MRN: [id number]  CC: Hyperlipidemia; Hypertension; and Annual Exam   HPI Gabrielle Long presents for a CPX.  She tells me her blood pressure has been well controlled with hydrochlorothiazide. She continues to exercise and denies any recent episodes of CP, DOE, SOB, palpitations, edema, or fatigue.  Outpatient Medications Prior to Visit  Medication Sig Dispense Refill  . Cholecalciferol (VITAMIN D3) 5000 UNITS CAPS Take by mouth.      . fluticasone (FLONASE) 50 MCG/ACT nasal spray Place 2 sprays into both nostrils daily. 16 g 11  . hydrochlorothiazide (HYDRODIURIL) 25 MG tablet Take 1 tablet (25 mg total) by mouth daily. 30 tablet 2  . Misc Natural Products (MENOPAUTONIC PO) Take by mouth.    . niacin 500 MG tablet Take 500 mg by mouth at bedtime.    . Omega-3 Fatty Acids (SALMON OIL PO) Take by mouth.    Marland Kitchen. OVER THE COUNTER MEDICATION Vitamin B7    . vitamin E 100 UNIT capsule Take 100 Units by mouth 3 (three) times daily.      No facility-administered medications prior to visit.     ROS Review of Systems  Constitutional: Negative for activity change, appetite change, diaphoresis, fatigue and unexpected weight change.  HENT: Negative.  Negative for sinus pressure and trouble swallowing.   Eyes: Negative for photophobia and visual disturbance.  Respiratory: Negative for cough, chest tightness, shortness of breath, wheezing and stridor.   Cardiovascular: Negative.  Negative for chest pain, palpitations and leg swelling.  Gastrointestinal: Negative for abdominal pain, constipation, diarrhea, nausea and vomiting.  Endocrine: Negative.  Negative for cold intolerance and heat intolerance.  Genitourinary: Negative.  Negative for difficulty urinating.  Musculoskeletal: Negative for back pain, myalgias and neck pain.  Skin: Negative.  Negative for color change and rash.  Allergic/Immunologic: Negative.     Neurological: Negative.  Negative for dizziness, weakness, light-headedness, numbness and headaches.  Hematological: Negative.  Negative for adenopathy. Does not bruise/bleed easily.  Psychiatric/Behavioral: Negative.     Objective:  BP 122/80 (BP Location: Left Arm, Patient Position: Sitting, Cuff Size: Normal)   Pulse 79   Temp 97.9 F (36.6 C) (Oral)   Resp 16   Ht 5\' 5"  (1.651 m)   Wt 162 lb 12 oz (73.8 kg)   SpO2 92%   BMI 27.08 kg/m   BP Readings from Last 3 Encounters:  11/07/16 122/80  10/23/16 (!) 158/98  11/02/15 128/88    Wt Readings from Last 3 Encounters:  11/07/16 162 lb 12 oz (73.8 kg)  10/23/16 160 lb (72.6 kg)  11/02/15 170 lb (77.1 kg)    Physical Exam  Constitutional: She is oriented to person, place, and time. No distress.  HENT:  Mouth/Throat: Oropharynx is clear and moist. No oropharyngeal exudate.  Eyes: Conjunctivae are normal. Right eye exhibits no discharge. Left eye exhibits no discharge. No scleral icterus.  Neck: Normal range of motion. Neck supple. No JVD present. No tracheal deviation present. No thyromegaly present.  Cardiovascular: Normal rate, regular rhythm, normal heart sounds and intact distal pulses.  Exam reveals no gallop and no friction rub.   No murmur heard. Pulmonary/Chest: Effort normal and breath sounds normal. No stridor. No respiratory distress. She has no wheezes. She has no rales. She exhibits no tenderness.  Abdominal: Soft. Bowel sounds are normal. She exhibits no distension and no mass. There is no tenderness. There is no rebound and  no guarding.  Musculoskeletal: Normal range of motion. She exhibits no edema, tenderness or deformity.  Lymphadenopathy:    She has no cervical adenopathy.  Neurological: She is oriented to person, place, and time.  Skin: Skin is dry. No rash noted. She is not diaphoretic. No erythema. No pallor.  Vitals reviewed.   Lab Results  Component Value Date   WBC 5.0 10/23/2016   HGB 14.3  10/23/2016   HCT 41.7 10/23/2016   PLT 326 10/23/2016   GLUCOSE 91 10/23/2016   CHOL 281 (A) 11/07/2016   TRIG 97 11/07/2016   HDL 75 (A) 11/07/2016   LDLCALC 187 11/07/2016   ALT 56 (H) 10/23/2016   AST 39 10/23/2016   NA 139 10/23/2016   K 4.2 10/23/2016   CL 103 10/23/2016   CREATININE 0.84 10/23/2016   BUN 8 10/23/2016   CO2 28 10/23/2016   TSH 1.14 11/07/2016   HGBA1C 5.7 11/07/2016   HGBA1C 5.7 11/07/2016    Ct Head Wo Contrast  Result Date: 10/23/2016 CLINICAL DATA:  55 year old female with headache symptoms since yesterday and high blood pressure. Initial encounter. EXAM: CT HEAD WITHOUT CONTRAST TECHNIQUE: Contiguous axial images were obtained from the base of the skull through the vertex without intravenous contrast. COMPARISON:  None. FINDINGS: Brain: Mild dystrophic calcifications in the bilateral globus pallidus. No midline shift, ventriculomegaly, mass effect, evidence of mass lesion, intracranial hemorrhage or evidence of cortically based acute infarction. Gray-white matter differentiation is within normal limits throughout the brain. Vascular: No suspicious intracranial vascular hyperdensity. Skull: No osseous abnormality identified. Sinuses/Orbits: Minimal left ethmoid air cell mucosal thickening. Other visible paranasal sinuses and mastoids are clear. Other: Visualized orbits and scalp soft tissues are within normal limits. IMPRESSION: Normal noncontrast CT appearance of the brain. Electronically Signed   By: Odessa FlemingH  Hall M.D.   On: 10/23/2016 12:49    Assessment & Plan:   Gabrielle Long was seen today for hyperlipidemia, hypertension and annual exam.  Diagnoses and all orders for this visit:  Need for prophylactic vaccination and inoculation against influenza -     Flu Vaccine QUAD 36+ mos PF IM (Fluarix & Fluzone Quad PF)  Hyperlipidemia with target LDL less than 160- she has an elevated LDL but a low Framingham risk score so I do not recommend that she start taking a  statin. -     Lipid panel -     Thyroid Panel With TSH  Hyperglycemia- her A1c stands at 5.7%, her blood sugars are adequately well controlled with lifestyle modifications. -     Hemoglobin A1c  Routine general medical examination at a health care facility- she refused a flu vaccine, recent mammogram and Pap smear were normal, her colonoscopy is up-to-date, exam completed, labs ordered and reviewed, patient education material was given.   I am having Ms. Guay maintain her vitamin E, Vitamin D3, OVER THE COUNTER MEDICATION, niacin, Omega-3 Fatty Acids (SALMON OIL PO), Misc Natural Products (MENOPAUTONIC PO), fluticasone, and hydrochlorothiazide.  No orders of the defined types were placed in this encounter.    Follow-up: Return in about 6 months (around 05/08/2017).  Sanda Lingerhomas Latamara Melder, MD

## 2016-11-08 ENCOUNTER — Encounter: Payer: Self-pay | Admitting: Internal Medicine

## 2016-11-08 NOTE — Progress Notes (Signed)
Notes received, abstracted and sent to scan.

## 2016-11-14 ENCOUNTER — Encounter: Payer: Self-pay | Admitting: Internal Medicine

## 2016-11-14 NOTE — Assessment & Plan Note (Signed)
Her blood pressure is well-controlled, electrolytes and renal function are stable.

## 2017-07-04 ENCOUNTER — Encounter: Payer: Self-pay | Admitting: Internal Medicine

## 2017-07-04 ENCOUNTER — Ambulatory Visit (INDEPENDENT_AMBULATORY_CARE_PROVIDER_SITE_OTHER): Payer: 59 | Admitting: Internal Medicine

## 2017-07-04 ENCOUNTER — Ambulatory Visit (INDEPENDENT_AMBULATORY_CARE_PROVIDER_SITE_OTHER)
Admission: RE | Admit: 2017-07-04 | Discharge: 2017-07-04 | Disposition: A | Discharge status: Home / Self Care | Payer: 59 | Source: Ambulatory Visit | Attending: Internal Medicine | Admitting: Internal Medicine

## 2017-07-04 DIAGNOSIS — M79642 Pain in left hand: Secondary | ICD-10-CM | POA: Insufficient documentation

## 2017-07-04 DIAGNOSIS — M25532 Pain in left wrist: Secondary | ICD-10-CM | POA: Diagnosis not present
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
## 2017-07-04 NOTE — Patient Instructions (Signed)
Wrist Pain, Adult There are many things that can cause wrist pain. Some common causes include:  An injury to the wrist area, such as a sprain, strain, or fracture.  Overuse of the joint.  A condition that causes increased pressure on a nerve in the wrist (carpal tunnel syndrome).  Wear and tear of the joints that occurs with aging (osteoarthritis).  A variety of other types of arthritis.  Sometimes, the cause of wrist pain is not known. Often, the pain goes away when you follow instructions from your health care provider for relieving pain at home, such as resting or icing the wrist. If your wrist pain continues, it is important to tell your health care provider. Follow these instructions at home:  Rest the wrist area for at least 48 hours or as long as told by your health care provider.  If a splint or elastic bandage has been applied, use it as told by your health care provider. ? Remove the splint or bandage only as told by your health care provider. ? Loosen the splint or bandage if your fingers tingle, become numb, or turn cold or blue.  If directed, apply ice to the injured area. ? If you have a removable splint or elastic bandage, remove it as told by your health care provider. ? Put ice in a plastic bag. ? Place a towel between your skin and the bag or between your splint or bandage and the bag. ? Leave the ice on for 20 minutes, 2-3 times a day.  Keep your arm raised (elevated) above the level of your heart while you are sitting or lying down.  Take over-the-counter and prescription medicines only as told by your health care provider.  Keep all follow-up visits as told by your health care provider. This is important. Contact a health care provider if:  You have a sudden sharp pain in the wrist, hand, or arm that is different or new.  The swelling or bruising on your wrist or hand gets worse.  Your skin becomes red, gets a rash, or has open sores.  Your pain does not  get better or it gets worse. Get help right away if:  You lose feeling in your fingers or hand.  Your fingers turn white, very red, or cold and blue.  You cannot move your fingers.  You have a fever or chills. This information is not intended to replace advice given to you by your health care provider. Make sure you discuss any questions you have with your health care provider. Document Released: 08/15/2005 Document Revised: 05/31/2016 Document Reviewed: 05/24/2016 [employer] Interactive Patient Education  2017 [employer]  

## 2017-12-29 DIAGNOSIS — R51 Headache: Secondary | ICD-10-CM | POA: Diagnosis not present

## 2017-12-29 DIAGNOSIS — R252 Cramp and spasm: Secondary | ICD-10-CM | POA: Diagnosis not present

## 2017-12-29 DIAGNOSIS — I10 Essential (primary) hypertension: Secondary | ICD-10-CM | POA: Diagnosis not present

## 2017-12-29 DIAGNOSIS — R69 Illness, unspecified: Secondary | ICD-10-CM | POA: Diagnosis not present

## 2017-12-29 DIAGNOSIS — R0789 Other chest pain: Secondary | ICD-10-CM | POA: Diagnosis not present

## 2017-12-29 DIAGNOSIS — R202 Paresthesia of skin: Secondary | ICD-10-CM | POA: Diagnosis not present

## 2017-12-29 DIAGNOSIS — Z5181 Encounter for therapeutic drug level monitoring: Secondary | ICD-10-CM | POA: Diagnosis not present

## 2017-12-31 ENCOUNTER — Other Ambulatory Visit: Payer: Self-pay | Admitting: Internal Medicine

## 2017-12-31 ENCOUNTER — Ambulatory Visit (INDEPENDENT_AMBULATORY_CARE_PROVIDER_SITE_OTHER): Payer: BLUE CROSS/BLUE SHIELD | Admitting: Internal Medicine

## 2017-12-31 ENCOUNTER — Encounter: Payer: Self-pay | Admitting: Internal Medicine

## 2017-12-31 VITALS — BP 150/100 | HR 88 | Temp 98.3°F | Resp 16 | Ht 65.0 in | Wt 160.1 lb

## 2017-12-31 DIAGNOSIS — R945 Abnormal results of liver function studies: Secondary | ICD-10-CM

## 2017-12-31 DIAGNOSIS — E559 Vitamin D deficiency, unspecified: Secondary | ICD-10-CM

## 2017-12-31 DIAGNOSIS — Z0000 Encounter for general adult medical examination without abnormal findings: Secondary | ICD-10-CM | POA: Diagnosis not present

## 2017-12-31 DIAGNOSIS — I10 Essential (primary) hypertension: Secondary | ICD-10-CM | POA: Diagnosis not present

## 2017-12-31 DIAGNOSIS — R739 Hyperglycemia, unspecified: Secondary | ICD-10-CM

## 2017-12-31 DIAGNOSIS — E785 Hyperlipidemia, unspecified: Secondary | ICD-10-CM | POA: Diagnosis not present

## 2017-12-31 DIAGNOSIS — R7989 Other specified abnormal findings of blood chemistry: Secondary | ICD-10-CM

## 2017-12-31 DIAGNOSIS — R778 Other specified abnormalities of plasma proteins: Secondary | ICD-10-CM | POA: Diagnosis not present

## 2017-12-31 DIAGNOSIS — Z23 Encounter for immunization: Secondary | ICD-10-CM | POA: Diagnosis not present

## 2017-12-31 DIAGNOSIS — Z1231 Encounter for screening mammogram for malignant neoplasm of breast: Secondary | ICD-10-CM

## 2017-12-31 LAB — BASIC METABOLIC PANEL
BUN: 12 (ref 4–21)
BUN: 12 (ref 4–21)
CREATININE: 1.1 (ref 0.5–1.1)
Creatinine: 1.1 (ref 0.5–1.1)
GLUCOSE: 95
Glucose: 95
POTASSIUM: 4 (ref 3.4–5.3)
Potassium: 4 (ref 3.4–5.3)
SODIUM: 141 (ref 137–147)
SODIUM: 141 (ref 137–147)

## 2017-12-31 LAB — HEPATIC FUNCTION PANEL
ALK PHOS: 68 (ref 25–125)
ALT: 31 (ref 7–35)
ALT: 31 (ref 7–35)
AST: 22 (ref 13–35)
AST: 22 (ref 13–35)
Alkaline Phosphatase: 68 (ref 25–125)
BILIRUBIN, TOTAL: 0.7
BILIRUBIN, TOTAL: 0.7

## 2017-12-31 LAB — HEMOGLOBIN A1C
HEMOGLOBIN A1C: 5.7
Hemoglobin A1C: 5.7

## 2017-12-31 LAB — LIPID PANEL
CHOLESTEROL: 289 — AB (ref 0–200)
CHOLESTEROL: 289 — AB (ref 0–200)
HDL: 83 — AB (ref 35–70)
HDL: 83 — AB (ref 35–70)
LDL CALC: 192
LDL Cholesterol: 192
TRIGLYCERIDES: 69 (ref 40–160)
Triglycerides: 69 (ref 40–160)

## 2017-12-31 LAB — TSH
TSH: 1.09 (ref 0.41–5.90)
TSH: 1.09 (ref 0.41–5.90)

## 2017-12-31 LAB — VITAMIN D 25 HYDROXY (VIT D DEFICIENCY, FRACTURES)
Vit D, 25-Hydroxy: 44.2
Vit D, 25-Hydroxy: 44.2

## 2017-12-31 MED ORDER — NEBIVOLOL HCL 5 MG PO TABS: 5.0000 mg | ORAL_TABLET | Freq: Every day | ORAL | 0 refills | Status: DC

## 2017-12-31 MED ORDER — HYDROCHLOROTHIAZIDE 25 MG PO TABS: 25.0000 mg | ORAL_TABLET | Freq: Every day | ORAL | 0 refills | Status: DC
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
## 2017-12-31 NOTE — Patient Instructions (Signed)

## 2018-01-02 ENCOUNTER — Encounter: Payer: Self-pay | Admitting: Internal Medicine

## 2018-01-02 DIAGNOSIS — Z1231 Encounter for screening mammogram for malignant neoplasm of breast: Secondary | ICD-10-CM | POA: Insufficient documentation

## 2018-01-02 DIAGNOSIS — R778 Other specified abnormalities of plasma proteins: Secondary | ICD-10-CM | POA: Insufficient documentation

## 2018-01-02 LAB — COMPLETE METABOLIC PANEL WITH GFR
CALCIUM: 10.7 (ref 8.7–10.7)
CREATININE: 1.1 (ref 0.5–1.1)
Total Protein: 10.7 g/dL

## 2018-01-02 NOTE — Progress Notes (Signed)
Result abstracted and sent to scan ° °

## 2018-01-03 LAB — COMPREHENSIVE METABOLIC PANEL
A/G RATIO: 1.7 (ref 1.2–2.2)
ALK PHOS: 68 IU/L (ref 39–117)
ALT: 31 IU/L (ref 0–32)
AST: 22 IU/L (ref 0–40)
Albumin: 5.5 g/dL (ref 3.5–5.5)
BILIRUBIN TOTAL: 0.7 mg/dL (ref 0.0–1.2)
BUN/Creatinine Ratio: 11 (ref 9–23)
BUN: 12 mg/dL (ref 6–24)
CALCIUM: 10.7 mg/dL — AB (ref 8.7–10.2)
CHLORIDE: 98 mmol/L (ref 96–106)
CO2: 25 mmol/L (ref 20–29)
Creatinine, Ser: 1.05 mg/dL — ABNORMAL HIGH (ref 0.57–1.00)
GFR calc Af Amer: 69 mL/min/{1.73_m2} (ref >59)
GFR calc non Af Amer: 60 mL/min/{1.73_m2} (ref >59)
Globulin, Total: 3.3 g/dL (ref 1.5–4.5)
Glucose: 95 mg/dL (ref 65–99)
POTASSIUM: 4 mmol/L (ref 3.5–5.2)
SODIUM: 141 mmol/L (ref 134–144)
Total Protein: 8.8 g/dL — ABNORMAL HIGH (ref 6.0–8.5)

## 2018-01-03 LAB — MICROSCOPIC EXAMINATION: Casts: NONE SEEN /lpf

## 2018-01-03 LAB — URINALYSIS, ROUTINE W REFLEX MICROSCOPIC
BILIRUBIN UA: NEGATIVE
GLUCOSE, UA: NEGATIVE
Ketones, UA: NEGATIVE
Nitrite, UA: NEGATIVE
PROTEIN UA: NEGATIVE
RBC UA: NEGATIVE
SPEC GRAV UA: 1.013 (ref 1.005–1.030)
UUROB: 0.2 mg/dL (ref 0.2–1.0)
pH, UA: 6 (ref 5.0–7.5)

## 2018-01-03 LAB — VITAMIN D 25 HYDROXY (VIT D DEFICIENCY, FRACTURES): VIT D 25 HYDROXY: 44.2 ng/mL (ref 30.0–100.0)

## 2018-01-03 LAB — LIPID PANEL W/O CHOL/HDL RATIO
CHOLESTEROL TOTAL: 289 mg/dL — AB (ref 100–199)
HDL: 83 mg/dL (ref >39)
LDL CALC: 192 mg/dL — AB (ref 0–99)
TRIGLYCERIDES: 69 mg/dL (ref 0–149)
VLDL Cholesterol Cal: 14 mg/dL (ref 5–40)

## 2018-01-03 LAB — HEPATITIS C ANTIBODY: Hep C Virus Ab: 0.1 s/co ratio (ref 0.0–0.9)

## 2018-01-03 LAB — THYROID PANEL WITH TSH
FREE THYROXINE INDEX: 1.9 (ref 1.2–4.9)
T3 Uptake Ratio: 28 % (ref 24–39)
T4, Total: 6.9 ug/dL (ref 4.5–12.0)
TSH: 1.09 u[IU]/mL (ref 0.450–4.500)

## 2018-01-03 LAB — HEPATITIS A ANTIBODY, TOTAL: HEP A TOTAL AB: POSITIVE — AB

## 2018-01-03 LAB — HGB A1C W/O EAG: HEMOGLOBIN A1C: 5.7 % — AB (ref 4.8–5.6)

## 2018-01-03 LAB — HEPATITIS B CORE ANTIBODY, TOTAL: HEP B C TOTAL AB: NEGATIVE

## 2018-01-03 LAB — HEPATITIS B SURFACE ANTIBODY,QUALITATIVE: Hep B Surface Ab, Qual: NONREACTIVE

## 2018-01-22 DIAGNOSIS — R079 Chest pain, unspecified: Secondary | ICD-10-CM | POA: Diagnosis not present

## 2018-01-22 DIAGNOSIS — I10 Essential (primary) hypertension: Secondary | ICD-10-CM | POA: Diagnosis not present

## 2018-01-24 DIAGNOSIS — R51 Headache: Secondary | ICD-10-CM | POA: Diagnosis not present

## 2018-01-24 DIAGNOSIS — H6983 Other specified disorders of Eustachian tube, bilateral: Secondary | ICD-10-CM | POA: Diagnosis not present

## 2018-01-27 ENCOUNTER — Ambulatory Visit: Payer: Self-pay | Admitting: *Deleted

## 2018-01-27 NOTE — Telephone Encounter (Signed)
Stop taking Bystolic and monitor your symptoms and monitor your blood pressure and let me know what happens.

## 2018-01-27 NOTE — Telephone Encounter (Signed)
(  Routing message to Dr. Yetta BarreJones to address)

## 2018-01-27 NOTE — Telephone Encounter (Signed)
Pt has called with c/o symptoms from her new b/p medication, bystolic.  She is having problems focusing, nervousness, headache, tired muscles, dizziness, night sweats, and fatigue,and coolness in hands and feet. She noticed this when she would take the bystolic, before breakfast, it happens in about an hour. With food it happens a little later. She wants to know what she can do.  Will notify the office per protocol.   Reason for Disposition . Caller has URGENT medication question about med that PCP prescribed and triager unable to answer question  Answer Assessment - Initial Assessment Questions 1. SYMPTOMS: "Do you have any symptoms?"     Can not focus, nervousness, night sweats, fatigue, tired muscles, headache, dizziness 2. SEVERITY: If symptoms are present, ask "Are they mild, moderate or severe?"     Moderate but can get to severe at times  Protocols used: MEDICATION QUESTION CALL-A-AH

## 2018-01-27 NOTE — Telephone Encounter (Signed)
Spoke with patient today and info given. She has been made aware to stop the Bystolic and touch basis with office to let us know how she was doing after the medication change. Patient voiced understanding and will follow up the end of the week or beginning of next week.

## 2018-01-29 ENCOUNTER — Other Ambulatory Visit: Payer: Self-pay | Admitting: Internal Medicine

## 2018-01-29 DIAGNOSIS — I10 Essential (primary) hypertension: Secondary | ICD-10-CM

## 2018-02-11 ENCOUNTER — Ambulatory Visit: Payer: BLUE CROSS/BLUE SHIELD | Admitting: *Deleted

## 2018-02-11 ENCOUNTER — Telehealth: Payer: Self-pay | Admitting: *Deleted

## 2018-02-11 VITALS — BP 132/88

## 2018-02-11 DIAGNOSIS — I10 Essential (primary) hypertension: Secondary | ICD-10-CM

## 2018-02-11 NOTE — Patient Instructions (Signed)
Pt states she has not taken the Bysolic since 01/27/18. MD did not give her an alternative. She is only taking the HCTZ. Inform pt I will send Dr. Yetta BarreJones a msg and ask if he want to rx anything else for BP and when she need to return back for F/U and give her a call a bck w/his response. See phone msg../lmb

## 2018-02-11 NOTE — Telephone Encounter (Signed)
Pt came in to have her BP checked it was 132/88. Pt states she has not taken the Bystolic since 01/27/18 was advise to stop taking due to side effects. She is currently taking the HCTZ daily. Pt is wanting to know does MD want her to take an alternative for the Bystolic since she can't take, and when does she need to return back to him.Marland Kitchen.Raechel Chute/lmb

## 2018-02-11 NOTE — Telephone Encounter (Signed)
Notified pt w/MD response. Made f/u appt for 03/24/18.Marland Kitchen.Raechel Chute/lmb

## 2018-02-11 NOTE — Telephone Encounter (Signed)
Her blood pressure is well controlled on hydrochlorothiazide.  She does not need to start a replacement for the Bystolic.

## 2018-03-24 ENCOUNTER — Other Ambulatory Visit: Payer: Self-pay | Admitting: Internal Medicine

## 2018-03-24 ENCOUNTER — Encounter: Payer: Self-pay | Admitting: Internal Medicine

## 2018-03-24 ENCOUNTER — Ambulatory Visit: Payer: BLUE CROSS/BLUE SHIELD | Admitting: Internal Medicine

## 2018-03-24 DIAGNOSIS — I10 Essential (primary) hypertension: Secondary | ICD-10-CM | POA: Diagnosis not present

## 2018-03-24 MED ORDER — AZILSARTAN-CHLORTHALIDONE 40-12.5 MG PO TABS: 1.0000 | ORAL_TABLET | Freq: Every day | ORAL | 0 refills | Status: DC

## 2018-03-24 NOTE — Patient Instructions (Signed)
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
## 2018-03-25 ENCOUNTER — Other Ambulatory Visit: Payer: Self-pay | Admitting: Obstetrics and Gynecology

## 2018-03-25 DIAGNOSIS — N6459 Other signs and symptoms in breast: Secondary | ICD-10-CM | POA: Diagnosis not present

## 2018-03-25 DIAGNOSIS — N6002 Solitary cyst of left breast: Secondary | ICD-10-CM

## 2018-03-26 LAB — ALDOSTERONE + RENIN ACTIVITY W/ RATIO
ALDOS/RENIN RATIO: 12.8 (ref 0.0–30.0)
ALDOSTERONE: 5.4 ng/dL (ref 0.0–30.0)
Renin: 0.421 ng/mL/hr (ref 0.167–5.380)

## 2018-03-26 LAB — BASIC METABOLIC PANEL
BUN/Creatinine Ratio: 9 (ref 9–23)
BUN: 9 mg/dL (ref 6–24)
CO2: 26 mmol/L (ref 20–29)
CREATININE: 0.97 mg/dL (ref 0.57–1.00)
Calcium: 10.9 mg/dL — ABNORMAL HIGH (ref 8.7–10.2)
Chloride: 98 mmol/L (ref 96–106)
GFR calc Af Amer: 76 mL/min/{1.73_m2} (ref >59)
GFR calc non Af Amer: 65 mL/min/{1.73_m2} (ref >59)
GLUCOSE: 86 mg/dL (ref 65–99)
Potassium: 3.9 mmol/L (ref 3.5–5.2)
Sodium: 141 mmol/L (ref 134–144)

## 2018-03-26 LAB — PTH, INTACT AND CALCIUM: PTH: 20 pg/mL (ref 15–65)

## 2018-03-27 ENCOUNTER — Encounter: Payer: Self-pay | Admitting: Internal Medicine

## 2018-03-29 ENCOUNTER — Encounter: Payer: Self-pay | Admitting: Internal Medicine

## 2018-03-31 ENCOUNTER — Ambulatory Visit
Admission: RE | Admit: 2018-03-31 | Discharge: 2018-03-31 | Disposition: A | Discharge status: Home / Self Care | Payer: BLUE CROSS/BLUE SHIELD | Source: Ambulatory Visit | Attending: Obstetrics and Gynecology | Admitting: Obstetrics and Gynecology

## 2018-03-31 DIAGNOSIS — R922 Inconclusive mammogram: Secondary | ICD-10-CM | POA: Diagnosis not present

## 2018-03-31 DIAGNOSIS — N6002 Solitary cyst of left breast: Secondary | ICD-10-CM

## 2018-03-31 DIAGNOSIS — N6489 Other specified disorders of breast: Secondary | ICD-10-CM | POA: Diagnosis not present

## 2018-04-28 ENCOUNTER — Other Ambulatory Visit: Payer: Self-pay | Admitting: Internal Medicine

## 2018-04-28 DIAGNOSIS — I10 Essential (primary) hypertension: Secondary | ICD-10-CM

## 2018-04-28 MED ORDER — AZILSARTAN-CHLORTHALIDONE 40-12.5 MG PO TABS: 1.0000 | ORAL_TABLET | Freq: Every day | ORAL | 1 refills | Status: DC

## 2018-12-19 DIAGNOSIS — Z23 Encounter for immunization: Secondary | ICD-10-CM | POA: Diagnosis not present

## 2018-12-29 DIAGNOSIS — Z6827 Body mass index (BMI) 27.0-27.9, adult: Secondary | ICD-10-CM | POA: Diagnosis not present

## 2018-12-29 DIAGNOSIS — Z1231 Encounter for screening mammogram for malignant neoplasm of breast: Secondary | ICD-10-CM | POA: Diagnosis not present

## 2018-12-29 DIAGNOSIS — Z01419 Encounter for gynecological examination (general) (routine) without abnormal findings: Secondary | ICD-10-CM | POA: Diagnosis not present

## 2018-12-31 ENCOUNTER — Other Ambulatory Visit: Payer: Self-pay | Admitting: Obstetrics and Gynecology

## 2018-12-31 DIAGNOSIS — R928 Other abnormal and inconclusive findings on diagnostic imaging of breast: Secondary | ICD-10-CM

## 2018-12-31 LAB — HM PAP SMEAR

## 2019-01-07 ENCOUNTER — Ambulatory Visit
Admission: RE | Admit: 2019-01-07 | Discharge: 2019-01-07 | Disposition: A | Discharge status: Home / Self Care | Payer: BLUE CROSS/BLUE SHIELD | Source: Ambulatory Visit | Attending: Obstetrics and Gynecology | Admitting: Obstetrics and Gynecology

## 2019-01-07 DIAGNOSIS — R928 Other abnormal and inconclusive findings on diagnostic imaging of breast: Secondary | ICD-10-CM | POA: Diagnosis not present

## 2019-05-14 ENCOUNTER — Encounter: Payer: Self-pay | Admitting: Internal Medicine

## 2019-05-15 ENCOUNTER — Other Ambulatory Visit: Payer: Self-pay | Admitting: Internal Medicine

## 2019-05-15 DIAGNOSIS — I10 Essential (primary) hypertension: Secondary | ICD-10-CM

## 2019-05-15 MED ORDER — EDARBYCLOR 40-12.5 MG PO TABS: 1.0000 | ORAL_TABLET | Freq: Every day | ORAL | 0 refills | Status: DC

## 2019-12-30 ENCOUNTER — Encounter: Payer: Self-pay | Admitting: Internal Medicine

## 2019-12-30 ENCOUNTER — Ambulatory Visit (INDEPENDENT_AMBULATORY_CARE_PROVIDER_SITE_OTHER): Payer: BC Managed Care – PPO | Admitting: Internal Medicine

## 2019-12-30 ENCOUNTER — Other Ambulatory Visit: Payer: Self-pay

## 2019-12-30 ENCOUNTER — Other Ambulatory Visit: Payer: Self-pay | Admitting: Internal Medicine

## 2019-12-30 VITALS — BP 116/84 | HR 83 | Temp 98.7°F | Resp 16 | Ht 65.0 in | Wt 160.0 lb

## 2019-12-30 DIAGNOSIS — Z0000 Encounter for general adult medical examination without abnormal findings: Secondary | ICD-10-CM

## 2019-12-30 DIAGNOSIS — E785 Hyperlipidemia, unspecified: Secondary | ICD-10-CM

## 2019-12-30 DIAGNOSIS — J301 Allergic rhinitis due to pollen: Secondary | ICD-10-CM | POA: Diagnosis not present

## 2019-12-30 DIAGNOSIS — I10 Essential (primary) hypertension: Secondary | ICD-10-CM

## 2019-12-30 DIAGNOSIS — R7989 Other specified abnormal findings of blood chemistry: Secondary | ICD-10-CM | POA: Diagnosis not present

## 2019-12-30 DIAGNOSIS — H6981 Other specified disorders of Eustachian tube, right ear: Secondary | ICD-10-CM

## 2019-12-30 MED ORDER — INDAPAMIDE 1.25 MG PO TABS: 1.2500 mg | ORAL_TABLET | Freq: Every day | ORAL | 1 refills | Status: DC

## 2019-12-30 MED ORDER — FLUTICASONE PROPIONATE 50 MCG/ACT NA SUSP: 2.0000 | Freq: Every day | NASAL | 11 refills | Status: DC

## 2019-12-30 NOTE — Progress Notes (Signed)
Subjective:  Patient ID: Gabrielle Long, female    DOB: 07/12/1961  Age: 59 y.o. MRN: [id number]  CC: Annual Exam  This visit occurred during the SARS-CoV-2 public health emergency.  Safety protocols were in place, including screening questions prior to the visit, additional usage of staff PPE, and extensive cleaning of exam room while observing appropriate contact time as indicated for disinfecting solutions.    HPI Gabrielle Long presents for a CPX.  She complains about the cost of EDARBYCLOR so she has been cutting it in half. She tells me that her BP has been well controlled. She denies HA, BV, dizziness, or lightheadedness.  Outpatient Medications Prior to Visit  Medication Sig Dispense Refill  . Cholecalciferol (VITAMIN D3) 5000 UNITS CAPS Take by mouth.      . Azilsartan-Chlorthalidone (EDARBYCLOR) 40-12.5 MG TABS Take 1 tablet by mouth daily. 90 tablet 0  . fluticasone (FLONASE) 50 MCG/ACT nasal spray Place 2 sprays into both nostrils daily. 16 g 11  . Misc Natural Products (MENOPAUTONIC PO) Take by mouth.    . Omega-3 Fatty Acids (SALMON OIL PO) Take by mouth.    . vitamin E 100 UNIT capsule Take 100 Units by mouth 3 (three) times daily.      No facility-administered medications prior to visit.    ROS Review of Systems  Constitutional: Negative for chills, diaphoresis, fatigue and unexpected weight change.  Eyes: Negative for visual disturbance.  Respiratory: Negative for cough, chest tightness, shortness of breath and wheezing.   Cardiovascular: Negative for chest pain, palpitations and leg swelling.  Gastrointestinal: Negative for abdominal pain, constipation, diarrhea, nausea and vomiting.  Endocrine: Negative.   Genitourinary: Negative.  Negative for difficulty urinating.  Musculoskeletal: Negative.  Negative for arthralgias and myalgias.  Skin: Negative.   Neurological: Negative.  Negative for dizziness, weakness, light-headedness and headaches.  Hematological:  Negative for adenopathy. Does not bruise/bleed easily.  Psychiatric/Behavioral: Negative.     Objective:  BP 116/84 (BP Location: Left Arm, Patient Position: Sitting, Cuff Size: Normal)   Pulse 83   Temp 98.7 F (37.1 C) (Oral)   Resp 16   Ht _0  (1.651 m)   Wt 160 lb (72.6 kg)   SpO2 99%   BMI 26.63 kg/m   BP Readings from Last 3 Encounters:  12/30/19 116/84  03/24/18 (!) 144/96  02/11/18 132/88    Wt Readings from Last 3 Encounters:  12/30/19 160 lb (72.6 kg)  03/24/18 158 lb (71.7 kg)  12/31/17 160 lb 1.9 oz (72.6 kg)    Physical Exam Vitals reviewed.  Constitutional:      Appearance: Normal appearance.  HENT:     Nose: Nose normal.     Mouth/Throat:     Mouth: Mucous membranes are moist.  Eyes:     General: No scleral icterus.    Conjunctiva/sclera: Conjunctivae normal.  Cardiovascular:     Rate and Rhythm: Normal rate and regular rhythm.     Pulses: Normal pulses.     Heart sounds: No murmur.  Pulmonary:     Effort: Pulmonary effort is normal.     Breath sounds: No stridor. No wheezing, rhonchi or rales.  Abdominal:     General: Abdomen is flat. Bowel sounds are normal. There is no distension.     Palpations: Abdomen is soft. There is no hepatomegaly, splenomegaly or mass.     Tenderness: There is no abdominal tenderness.  Musculoskeletal:        General: Normal range of motion.  Cervical back: Neck supple.     Right lower leg: No edema.     Left lower leg: No edema.  Lymphadenopathy:     Cervical: No cervical adenopathy.  Skin:    General: Skin is warm and dry.     Coloration: Skin is not pale.  Neurological:     General: No focal deficit present.     Mental Status: She is alert.  Psychiatric:        Behavior: Behavior normal.     Lab Results  Component Value Date   WBC 5.9 12/30/2019   HGB 13.1 12/30/2019   HCT 38.3 12/30/2019   PLT 338 12/30/2019   GLUCOSE 86 12/30/2019   CHOL 272 (H) 12/30/2019   TRIG 91 12/30/2019   HDL 68  12/30/2019   LDLCALC 189 (H) 12/30/2019   ALT 23 12/30/2019   AST 20 12/30/2019   NA 139 12/30/2019   K 3.8 12/30/2019   CL 98 12/30/2019   CREATININE 1.00 12/30/2019   BUN 10 12/30/2019   CO2 25 12/30/2019   TSH 0.975 12/30/2019   HGBA1C 5.7 (H) 12/31/2017    MM DIAG BREAST TOMO UNI LEFT  Result Date: 01/07/2019 CLINICAL DATA:  Possible mass in mid left breast in the oblique projection of a recent screening mammogram. EXAM: DIGITAL DIAGNOSTIC UNILATERAL LEFT MAMMOGRAM WITH CAD AND TOMO COMPARISON:  Previous exam(s). ACR Breast Density Category c: The breast tissue is heterogeneously dense, which may obscure small masses. FINDINGS: 3D tomographic and 2D generated true lateral and spot compression oblique views of the left breast were obtained. These confirm 2 small, oval, circumscribed masses in the 3 o'clock position of the breast. These correspond to normal intramammary lymph nodes seen at mammography and ultrasound on 03/31/2018 and are unchanged in the craniocaudal projection. Mammographic images were processed with CAD. IMPRESSION: The recently suspected left breast mass is a mammographically stable normal intramammary lymph node with a 2nd adjacent stable mammographically normal intramammary lymph node. No evidence of malignancy. RECOMMENDATION: Bilateral screening mammogram in 1 year. I have discussed the findings and recommendations with the patient. Results were also provided in writing at the conclusion of the visit. If applicable, a reminder letter will be sent to the patient regarding the next appointment. BI-RADS CATEGORY  2: Benign. Electronically Signed   By: Claudie Revering M.D.   On: 01/07/2019 08:52    Assessment & Plan:   Rokhaya was seen today for annual exam.  Diagnoses and all orders for this visit:  Routine general medical examination at a health care facility- Exam completed, labs reviewed, vaccines were reviewed, screening for cervical and colon cancer is UTD, she has a  mammogram scheduled soon, pt ed was given. -     Lipid Panel With LDL/HDL Ratio; Future -     HIV Antibody (routine testing w rflx); Future  Eustachian tube dysfunction, right  Allergic rhinitis due to pollen -     fluticasone (FLONASE) 50 MCG/ACT nasal spray; Place 2 sprays into both nostrils daily.  Essential hypertension- Her SBP is down to 116 and she complains about the cost of edarbyclor. Will discontinue the ARB and will control the BP with a thiazide diuretic as monotherapy. -     Vitamin D (25 hydroxy); Future -     CBC with Differential/Platelet; Future -     TSH; Future -     indapamide (LOZOL) 1.25 MG tablet; Take 1 tablet (1.25 mg total) by mouth daily.  Elevated LFTs- Her LFt's  are normal now. -     Comp Met (CMET); Future  Hyperlipidemia with target LDL less than 160 - she does not have an elevated ASCVD risk score so I did not recommend a statin for CV risk reduction. -     Comp Met (CMET); Future -     TSH; Future   I have discontinued Johnda Bembenek's vitamin E, Omega-3 Fatty Acids (SALMON OIL PO), Misc Natural Products (MENOPAUTONIC PO), and Edarbyclor. I am also having her start on indapamide. Additionally, I am having her maintain her Vitamin D3 and fluticasone.  Meds ordered this encounter  Medications  . fluticasone (FLONASE) 50 MCG/ACT nasal spray    Sig: Place 2 sprays into both nostrils daily.    Dispense:  16 g    Refill:  11  . indapamide (LOZOL) 1.25 MG tablet    Sig: Take 1 tablet (1.25 mg total) by mouth daily.    Dispense:  90 tablet    Refill:  1     Follow-up: Return in about 6 months (around 06/28/2020).  Scarlette Calico, MD

## 2019-12-30 NOTE — Patient Instructions (Signed)

## 2019-12-31 LAB — COMPREHENSIVE METABOLIC PANEL
ALT: 23 IU/L (ref 0–32)
AST: 20 IU/L (ref 0–40)
Albumin/Globulin Ratio: 1.5 (ref 1.2–2.2)
Albumin: 4.8 g/dL (ref 3.8–4.9)
Alkaline Phosphatase: 64 IU/L (ref 39–117)
BUN/Creatinine Ratio: 10 (ref 9–23)
BUN: 10 mg/dL (ref 6–24)
Bilirubin Total: 0.5 mg/dL (ref 0.0–1.2)
CO2: 25 mmol/L (ref 20–29)
Calcium: 10.2 mg/dL (ref 8.7–10.2)
Chloride: 98 mmol/L (ref 96–106)
Creatinine, Ser: 1 mg/dL (ref 0.57–1.00)
GFR calc Af Amer: 72 mL/min/{1.73_m2} (ref >59)
GFR calc non Af Amer: 63 mL/min/{1.73_m2} (ref >59)
Globulin, Total: 3.1 g/dL (ref 1.5–4.5)
Glucose: 86 mg/dL (ref 65–99)
Potassium: 3.8 mmol/L (ref 3.5–5.2)
Sodium: 139 mmol/L (ref 134–144)
Total Protein: 7.9 g/dL (ref 6.0–8.5)

## 2019-12-31 LAB — CBC WITH DIFFERENTIAL/PLATELET
Basophils Absolute: 0.1 10*3/uL (ref 0.0–0.2)
Basos: 1 %
EOS (ABSOLUTE): 0.4 10*3/uL (ref 0.0–0.4)
Eos: 7 %
Hematocrit: 38.3 % (ref 34.0–46.6)
Hemoglobin: 13.1 g/dL (ref 11.1–15.9)
Immature Grans (Abs): 0 10*3/uL (ref 0.0–0.1)
Immature Granulocytes: 0 %
Lymphocytes Absolute: 2 10*3/uL (ref 0.7–3.1)
Lymphs: 34 %
MCH: 28.7 pg (ref 26.6–33.0)
MCHC: 34.2 g/dL (ref 31.5–35.7)
MCV: 84 fL (ref 79–97)
Monocytes Absolute: 0.4 10*3/uL (ref 0.1–0.9)
Monocytes: 6 %
Neutrophils Absolute: 3.1 10*3/uL (ref 1.4–7.0)
Neutrophils: 52 %
Platelets: 338 10*3/uL (ref 150–450)
RBC: 4.56 x10E6/uL (ref 3.77–5.28)
RDW: 13.3 % (ref 11.7–15.4)
WBC: 5.9 10*3/uL (ref 3.4–10.8)

## 2019-12-31 LAB — LIPID PANEL WITH LDL/HDL RATIO
Cholesterol, Total: 272 mg/dL — ABNORMAL HIGH (ref 100–199)
HDL: 68 mg/dL (ref >39)
LDL Chol Calc (NIH): 189 mg/dL — ABNORMAL HIGH (ref 0–99)
LDL/HDL Ratio: 2.8 ratio (ref 0.0–3.2)
Triglycerides: 91 mg/dL (ref 0–149)
VLDL Cholesterol Cal: 15 mg/dL (ref 5–40)

## 2019-12-31 LAB — HIV ANTIBODY (ROUTINE TESTING W REFLEX): HIV Screen 4th Generation wRfx: NONREACTIVE

## 2019-12-31 LAB — TSH: TSH: 0.975 u[IU]/mL (ref 0.450–4.500)

## 2019-12-31 LAB — VITAMIN D 25 HYDROXY (VIT D DEFICIENCY, FRACTURES): Vit D, 25-Hydroxy: 56.3 ng/mL (ref 30.0–100.0)

## 2020-01-04 ENCOUNTER — Encounter: Payer: Self-pay | Admitting: Internal Medicine

## 2020-01-13 LAB — HM MAMMOGRAPHY: HM Mammogram: NORMAL (ref 0–4)

## 2020-01-20 DIAGNOSIS — Z1231 Encounter for screening mammogram for malignant neoplasm of breast: Secondary | ICD-10-CM | POA: Diagnosis not present

## 2020-01-20 DIAGNOSIS — Z01419 Encounter for gynecological examination (general) (routine) without abnormal findings: Secondary | ICD-10-CM | POA: Diagnosis not present

## 2020-01-20 DIAGNOSIS — Z6827 Body mass index (BMI) 27.0-27.9, adult: Secondary | ICD-10-CM | POA: Diagnosis not present

## 2020-02-04 DIAGNOSIS — D252 Subserosal leiomyoma of uterus: Secondary | ICD-10-CM | POA: Diagnosis not present

## 2020-02-04 DIAGNOSIS — Z13820 Encounter for screening for osteoporosis: Secondary | ICD-10-CM | POA: Diagnosis not present

## 2020-02-04 DIAGNOSIS — D251 Intramural leiomyoma of uterus: Secondary | ICD-10-CM | POA: Diagnosis not present

## 2020-06-27 ENCOUNTER — Other Ambulatory Visit: Payer: Self-pay | Admitting: Internal Medicine

## 2020-06-27 DIAGNOSIS — I10 Essential (primary) hypertension: Secondary | ICD-10-CM

## 2020-06-27 DIAGNOSIS — X500XXA Overexertion from strenuous movement or load, initial encounter: Secondary | ICD-10-CM | POA: Diagnosis not present

## 2020-06-27 DIAGNOSIS — S93401A Sprain of unspecified ligament of right ankle, initial encounter: Secondary | ICD-10-CM | POA: Diagnosis not present

## 2020-06-27 DIAGNOSIS — M79671 Pain in right foot: Secondary | ICD-10-CM | POA: Diagnosis not present

## 2020-06-27 DIAGNOSIS — Y92009 Unspecified place in unspecified non-institutional (private) residence as the place of occurrence of the external cause: Secondary | ICD-10-CM | POA: Diagnosis not present

## 2020-06-27 DIAGNOSIS — M25571 Pain in right ankle and joints of right foot: Secondary | ICD-10-CM | POA: Diagnosis not present

## 2020-06-28 ENCOUNTER — Encounter: Payer: Self-pay | Admitting: Internal Medicine

## 2020-06-28 ENCOUNTER — Ambulatory Visit: Payer: BC Managed Care – PPO | Admitting: Internal Medicine

## 2020-06-28 ENCOUNTER — Other Ambulatory Visit: Payer: Self-pay

## 2020-06-28 VITALS — BP 128/84 | HR 69 | Temp 98.0°F | Resp 16 | Ht 65.0 in | Wt 163.0 lb

## 2020-06-28 DIAGNOSIS — I10 Essential (primary) hypertension: Secondary | ICD-10-CM | POA: Diagnosis not present

## 2020-06-28 DIAGNOSIS — E785 Hyperlipidemia, unspecified: Secondary | ICD-10-CM

## 2020-06-28 DIAGNOSIS — E559 Vitamin D deficiency, unspecified: Secondary | ICD-10-CM

## 2020-06-28 DIAGNOSIS — Z23 Encounter for immunization: Secondary | ICD-10-CM | POA: Diagnosis not present

## 2020-06-28 NOTE — Progress Notes (Signed)
Subjective:  Patient ID: Gabrielle Long, female    DOB: [birth year]  Age: 59 y.o. MRN: [id number]  CC: Hypertension and Hyperlipidemia  This visit occurred during the SARS-CoV-2 public health emergency.  Safety protocols were in place, including screening questions prior to the visit, additional usage of staff PPE, and extensive cleaning of exam room while observing appropriate contact time as indicated for disinfecting solutions.    HPI Gabrielle Long presents for f/up - She tells me that her blood pressure has been well controlled.  She is active and denies any recent episodes of headache, blurred vision, chest pain, shortness of breath, dizziness, or lightheadedness.  Outpatient Medications Prior to Visit  Medication Sig Dispense Refill  . Cholecalciferol (VITAMIN D3) 5000 UNITS CAPS Take by mouth.      . fluticasone (FLONASE) 50 MCG/ACT nasal spray Place 2 sprays into both nostrils daily. 16 g 11  . indapamide (LOZOL) 1.25 MG tablet TAKE 1 TABLET(1.25 MG) BY MOUTH DAILY 90 tablet 0   No facility-administered medications prior to visit.    ROS Review of Systems  Constitutional: Negative for appetite change, chills, diaphoresis, fatigue and fever.  HENT: Negative.   Eyes: Negative for visual disturbance.  Respiratory: Negative for cough, chest tightness, shortness of breath and wheezing.   Cardiovascular: Negative for chest pain, palpitations and leg swelling.  Gastrointestinal: Negative for abdominal pain, constipation, diarrhea, nausea and vomiting.  Endocrine: Negative.   Genitourinary: Negative.  Negative for difficulty urinating.  Musculoskeletal: Negative for arthralgias and myalgias.  Skin: Negative.  Negative for color change and pallor.  Neurological: Negative.  Negative for dizziness, weakness, light-headedness and headaches.  Hematological: Negative for adenopathy. Does not bruise/bleed easily.  Psychiatric/Behavioral: Negative.     Objective:  BP 128/84 (BP  Location: Left Arm, Patient Position: Sitting, Cuff Size: Normal)   Pulse 69   Temp 98 F (36.7 C) (Oral)   Resp 16   Ht 5\' 5"  (1.651 m)   Wt 163 lb (73.9 kg)   SpO2 98%   BMI 27.12 kg/m   BP Readings from Last 3 Encounters:  06/28/20 128/84  12/30/19 116/84  03/24/18 (!) 144/96    Wt Readings from Last 3 Encounters:  06/28/20 163 lb (73.9 kg)  12/30/19 160 lb (72.6 kg)  03/24/18 158 lb (71.7 kg)    Physical Exam Vitals reviewed.  Constitutional:      Appearance: Normal appearance.  HENT:     Nose: Nose normal.     Mouth/Throat:     Mouth: Mucous membranes are moist.  Eyes:     General: No scleral icterus.    Conjunctiva/sclera: Conjunctivae normal.  Cardiovascular:     Rate and Rhythm: Normal rate and regular rhythm.     Heart sounds: No murmur heard.   Pulmonary:     Effort: Pulmonary effort is normal.     Breath sounds: No stridor. No wheezing, rhonchi or rales.  Abdominal:     General: Abdomen is flat.     Palpations: There is no mass.     Tenderness: There is no abdominal tenderness. There is no guarding.  Musculoskeletal:        General: Normal range of motion.     Cervical back: Neck supple.     Right lower leg: No edema.     Left lower leg: No edema.  Lymphadenopathy:     Cervical: No cervical adenopathy.  Skin:    General: Skin is warm and dry.     Coloration: Skin  is not pale.  Neurological:     General: No focal deficit present.     Mental Status: She is alert.  Psychiatric:        Mood and Affect: Mood normal.        Behavior: Behavior normal.     Lab Results  Component Value Date   WBC 5.9 12/30/2019   HGB 13.1 12/30/2019   HCT 38.3 12/30/2019   PLT 338 12/30/2019   GLUCOSE 96 06/29/2020   CHOL 256 (H) 06/29/2020   TRIG 91 06/29/2020   HDL 68 06/29/2020   LDLCALC 172 (H) 06/29/2020   ALT 23 12/30/2019   AST 20 12/30/2019   NA 139 06/29/2020   K 3.8 06/29/2020   CL 100 06/29/2020   CREATININE 0.92 06/29/2020   BUN 13  06/29/2020   CO2 25 06/29/2020   TSH 0.975 12/30/2019   HGBA1C 5.7 (H) 12/31/2017    MM DIAG BREAST TOMO UNI LEFT  Result Date: 01/07/2019 CLINICAL DATA:  Possible mass in mid left breast in the oblique projection of a recent screening mammogram. EXAM: DIGITAL DIAGNOSTIC UNILATERAL LEFT MAMMOGRAM WITH CAD AND TOMO COMPARISON:  Previous exam(s). ACR Breast Density Category c: The breast tissue is heterogeneously dense, which may obscure small masses. FINDINGS: 3D tomographic and 2D generated true lateral and spot compression oblique views of the left breast were obtained. These confirm 2 small, oval, circumscribed masses in the 3 o'clock position of the breast. These correspond to normal intramammary lymph nodes seen at mammography and ultrasound on 03/31/2018 and are unchanged in the craniocaudal projection. Mammographic images were processed with CAD. IMPRESSION: The recently suspected left breast mass is a mammographically stable normal intramammary lymph node with a 2nd adjacent stable mammographically normal intramammary lymph node. No evidence of malignancy. RECOMMENDATION: Bilateral screening mammogram in 1 year. I have discussed the findings and recommendations with the patient. Results were also provided in writing at the conclusion of the visit. If applicable, a reminder letter will be sent to the patient regarding the next appointment. BI-RADS CATEGORY  2: Benign. Electronically Signed   By: Beckie Salts M.D.   On: 01/07/2019 08:52    Assessment & Plan:   Temitope was seen today for hypertension and hyperlipidemia.  Diagnoses and all orders for this visit:  Essential hypertension- Her blood pressure is well controlled.  Electrolytes and renal function are normal. -     Basic metabolic panel; Future  Hyperlipidemia with target LDL less than 160- She does not have an elevated ASCVD risk score so I did not recommend a statin for CV risk reduction. -     Lipid panel; Future  Vitamin D  deficiency- Her vitamin D level is normal now. -     VITAMIN D 25 Hydroxy (Vit-D Deficiency, Fractures); Future  Need for shingles vaccine -     Varicella-zoster vaccine IM   I am having Gabrielle Long maintain her Vitamin D3, fluticasone, and indapamide.  No orders of the defined types were placed in this encounter.    Follow-up: Return in about 6 months (around 12/29/2020).  Sanda Linger, MD

## 2020-06-28 NOTE — Patient Instructions (Signed)

## 2020-06-29 ENCOUNTER — Other Ambulatory Visit: Payer: Self-pay | Admitting: Internal Medicine

## 2020-06-29 DIAGNOSIS — I10 Essential (primary) hypertension: Secondary | ICD-10-CM | POA: Diagnosis not present

## 2020-06-29 DIAGNOSIS — E785 Hyperlipidemia, unspecified: Secondary | ICD-10-CM | POA: Diagnosis not present

## 2020-06-29 DIAGNOSIS — E559 Vitamin D deficiency, unspecified: Secondary | ICD-10-CM | POA: Diagnosis not present

## 2020-06-30 LAB — LIPID PANEL
Chol/HDL Ratio: 3.8 ratio (ref 0.0–4.4)
Cholesterol, Total: 256 mg/dL — ABNORMAL HIGH (ref 100–199)
HDL: 68 mg/dL (ref >39)
LDL Chol Calc (NIH): 172 mg/dL — ABNORMAL HIGH (ref 0–99)
Triglycerides: 91 mg/dL (ref 0–149)
VLDL Cholesterol Cal: 16 mg/dL (ref 5–40)

## 2020-06-30 LAB — BASIC METABOLIC PANEL
BUN/Creatinine Ratio: 14 (ref 9–23)
BUN: 13 mg/dL (ref 6–24)
CO2: 25 mmol/L (ref 20–29)
Calcium: 8.8 mg/dL (ref 8.7–10.2)
Chloride: 100 mmol/L (ref 96–106)
Creatinine, Ser: 0.92 mg/dL (ref 0.57–1.00)
GFR calc Af Amer: 79 mL/min/{1.73_m2} (ref >59)
GFR calc non Af Amer: 69 mL/min/{1.73_m2} (ref >59)
Glucose: 96 mg/dL (ref 65–99)
Potassium: 3.8 mmol/L (ref 3.5–5.2)
Sodium: 139 mmol/L (ref 134–144)

## 2020-06-30 LAB — VITAMIN D 25 HYDROXY (VIT D DEFICIENCY, FRACTURES): Vit D, 25-Hydroxy: 51.3 ng/mL (ref 30.0–100.0)

## 2020-07-12 DIAGNOSIS — X500XXA Overexertion from strenuous movement or load, initial encounter: Secondary | ICD-10-CM | POA: Diagnosis not present

## 2020-07-12 DIAGNOSIS — S93401A Sprain of unspecified ligament of right ankle, initial encounter: Secondary | ICD-10-CM | POA: Diagnosis not present

## 2020-08-10 DIAGNOSIS — M24471 Recurrent dislocation, right ankle: Secondary | ICD-10-CM | POA: Diagnosis not present

## 2020-08-10 DIAGNOSIS — M24472 Recurrent dislocation, left ankle: Secondary | ICD-10-CM | POA: Diagnosis not present

## 2020-08-10 DIAGNOSIS — S93401A Sprain of unspecified ligament of right ankle, initial encounter: Secondary | ICD-10-CM | POA: Diagnosis not present

## 2020-08-10 DIAGNOSIS — S93491A Sprain of other ligament of right ankle, initial encounter: Secondary | ICD-10-CM | POA: Diagnosis not present

## 2020-08-11 DIAGNOSIS — M24471 Recurrent dislocation, right ankle: Secondary | ICD-10-CM | POA: Diagnosis not present

## 2020-08-11 DIAGNOSIS — M25571 Pain in right ankle and joints of right foot: Secondary | ICD-10-CM | POA: Diagnosis not present

## 2020-08-27 DIAGNOSIS — Z03818 Encounter for observation for suspected exposure to other biological agents ruled out: Secondary | ICD-10-CM | POA: Diagnosis not present

## 2020-09-12 DIAGNOSIS — Z03822 Encounter for observation for suspected aspirated (inhaled) foreign body ruled out: Secondary | ICD-10-CM | POA: Diagnosis not present

## 2020-09-12 DIAGNOSIS — M25571 Pain in right ankle and joints of right foot: Secondary | ICD-10-CM | POA: Diagnosis not present

## 2020-09-12 DIAGNOSIS — M24471 Recurrent dislocation, right ankle: Secondary | ICD-10-CM | POA: Diagnosis not present

## 2020-09-24 ENCOUNTER — Other Ambulatory Visit: Payer: Self-pay | Admitting: Internal Medicine

## 2020-09-24 DIAGNOSIS — I10 Essential (primary) hypertension: Secondary | ICD-10-CM

## 2020-09-28 ENCOUNTER — Ambulatory Visit (INDEPENDENT_AMBULATORY_CARE_PROVIDER_SITE_OTHER): Payer: BC Managed Care – PPO

## 2020-09-28 ENCOUNTER — Other Ambulatory Visit: Payer: Self-pay

## 2020-09-28 DIAGNOSIS — Z23 Encounter for immunization: Secondary | ICD-10-CM | POA: Diagnosis not present

## 2020-09-28 NOTE — Progress Notes (Signed)
2nd Shingrix vaccine given to pt w/o complications.

## 2020-11-03 DIAGNOSIS — H6981 Other specified disorders of Eustachian tube, right ear: Secondary | ICD-10-CM | POA: Diagnosis not present

## 2020-11-03 DIAGNOSIS — J3489 Other specified disorders of nose and nasal sinuses: Secondary | ICD-10-CM | POA: Diagnosis not present

## 2020-11-14 ENCOUNTER — Other Ambulatory Visit: Payer: Self-pay | Admitting: Internal Medicine

## 2020-11-14 DIAGNOSIS — I10 Essential (primary) hypertension: Secondary | ICD-10-CM

## 2020-11-15 MED ORDER — INDAPAMIDE 1.25 MG PO TABS: 1.2500 mg | ORAL_TABLET | Freq: Every day | ORAL | 0 refills | Status: DC

## 2020-12-19 DIAGNOSIS — H25013 Cortical age-related cataract, bilateral: Secondary | ICD-10-CM | POA: Diagnosis not present

## 2020-12-19 DIAGNOSIS — H04123 Dry eye syndrome of bilateral lacrimal glands: Secondary | ICD-10-CM | POA: Diagnosis not present

## 2021-01-02 DIAGNOSIS — H25013 Cortical age-related cataract, bilateral: Secondary | ICD-10-CM | POA: Diagnosis not present

## 2021-01-02 DIAGNOSIS — H04123 Dry eye syndrome of bilateral lacrimal glands: Secondary | ICD-10-CM | POA: Diagnosis not present

## 2021-02-15 DIAGNOSIS — Z1231 Encounter for screening mammogram for malignant neoplasm of breast: Secondary | ICD-10-CM | POA: Diagnosis not present

## 2021-02-15 DIAGNOSIS — Z01419 Encounter for gynecological examination (general) (routine) without abnormal findings: Secondary | ICD-10-CM | POA: Diagnosis not present

## 2021-02-15 DIAGNOSIS — Z6827 Body mass index (BMI) 27.0-27.9, adult: Secondary | ICD-10-CM | POA: Diagnosis not present

## 2021-02-16 DIAGNOSIS — Z7189 Other specified counseling: Secondary | ICD-10-CM | POA: Diagnosis not present

## 2021-02-28 ENCOUNTER — Other Ambulatory Visit: Payer: Self-pay | Admitting: Internal Medicine

## 2021-02-28 DIAGNOSIS — J301 Allergic rhinitis due to pollen: Secondary | ICD-10-CM

## 2021-06-26 DIAGNOSIS — H25013 Cortical age-related cataract, bilateral: Secondary | ICD-10-CM | POA: Diagnosis not present

## 2021-06-26 DIAGNOSIS — H04123 Dry eye syndrome of bilateral lacrimal glands: Secondary | ICD-10-CM | POA: Diagnosis not present

## 2021-09-26 ENCOUNTER — Other Ambulatory Visit: Payer: Self-pay | Admitting: Internal Medicine

## 2021-09-26 ENCOUNTER — Encounter: Payer: Self-pay | Admitting: Internal Medicine

## 2021-09-26 DIAGNOSIS — I10 Essential (primary) hypertension: Secondary | ICD-10-CM

## 2021-10-02 ENCOUNTER — Encounter: Payer: Self-pay | Admitting: Internal Medicine

## 2021-10-02 ENCOUNTER — Other Ambulatory Visit: Payer: Self-pay

## 2021-10-02 ENCOUNTER — Ambulatory Visit: Payer: BC Managed Care – PPO | Admitting: Internal Medicine

## 2021-10-02 VITALS — BP 122/70 | HR 70 | Temp 98.2°F | Ht 65.0 in | Wt 161.0 lb

## 2021-10-02 DIAGNOSIS — E785 Hyperlipidemia, unspecified: Secondary | ICD-10-CM | POA: Diagnosis not present

## 2021-10-02 DIAGNOSIS — Z0001 Encounter for general adult medical examination with abnormal findings: Secondary | ICD-10-CM

## 2021-10-02 DIAGNOSIS — E559 Vitamin D deficiency, unspecified: Secondary | ICD-10-CM | POA: Diagnosis not present

## 2021-10-02 DIAGNOSIS — I10 Essential (primary) hypertension: Secondary | ICD-10-CM

## 2021-10-02 DIAGNOSIS — J4520 Mild intermittent asthma, uncomplicated: Secondary | ICD-10-CM

## 2021-10-02 MED ORDER — INDAPAMIDE 1.25 MG PO TABS
1.2500 mg | ORAL_TABLET | Freq: Every day | ORAL | 3 refills | Status: DC
Start: 2021-10-02 — End: 2022-10-23

## 2021-10-02 NOTE — Progress Notes (Signed)
Patient ID: Gabrielle Long, female   DOB: June 11, 1961, 60 y.o.   MRN: [id number]         Chief Complaint:: wellness exam and hld,, htn, low vit d, asthma as pcp not available today       HPI:  Gabrielle Long is a 60 y.o. female here for wellness exam; has colonoscopy sched for dec 2 with hx of colon polyps; declines covid booster, o/w up to date                        Also trying to follow lower chol diet.  Pt denies chest pain, increased sob or doe, wheezing, orthopnea, PND, increased LE swelling, palpitations, dizziness or syncope.   Pt denies polydipsia, polyuria, or new focal neuro s/s.   Willing for cardiac Ct score.   . Pt denies fever, wt loss, night sweats, loss of appetite, or other constitutional symptoms  No other cute symptoms.  Never smoked.     Wt Readings from Last 3 Encounters:  10/02/21 161 lb (73 kg)  06/28/20 163 lb (73.9 kg)  12/30/19 160 lb (72.6 kg)   BP Readings from Last 3 Encounters:  10/02/21 122/70  06/28/20 128/84  12/30/19 116/84   Immunization History  Administered Date(s) Administered   Hepatitis A, Adult 05/27/2015   Influenza Whole 08/27/2012, 08/18/2013   Influenza,inj,Quad PF,6+ Mos 11/07/2016, 12/31/2017, 07/30/2019   Influenza-Unspecified 09/20/2015, 10/06/2015, 12/19/2018, 09/22/2021   Janssen (J&J) SARS-COV-2 Vaccination 01/27/2020, 10/08/2020   Moderna SARS-COV2 Booster Vaccination 05/16/2021   Pneumococcal Conjugate-13 02/16/2021   Pneumococcal Polysaccharide-23 02/16/2013   Td 11/19/2006, 09/18/2014   Tdap 02/16/2021   Typhoid Live 05/27/2015   Yellow Fever 05/27/2015   Zoster Recombinat (Shingrix) 06/28/2020, 09/28/2020   There are no preventive care reminders to display for this patient.     Past Medical History:  Diagnosis Date   Asthma    Degenerative joint disease    Hyperlipidemia    Hypertension    Low back pain    History reviewed. No pertinent surgical history.  reports that she has never smoked. She has never used  smokeless tobacco. She reports current alcohol use. She reports that she does not use drugs. family history includes Cancer in an other family member; Cancer (age of onset: 35) in her maternal grandmother; Hypertension in her father, sister, and another family member; Kidney disease in an other family member. Allergies  Allergen Reactions   Bystolic [Nebivolol Hcl] Palpitations    Med make her have bad side effects heart palpitations, Dizziness, Fatigue and Night Sweats   Current Outpatient Medications on File Prior to Visit  Medication Sig Dispense Refill   Cholecalciferol (VITAMIN D3) 5000 UNITS CAPS Take by mouth.       fluticasone (FLONASE) 50 MCG/ACT nasal spray SHAKE LIQUID AND USE 2 SPRAYS IN EACH NOSTRIL DAILY 16 g 11   Omega-3 Fatty Acids (FISH OIL) 1000 MG CAPS      No current facility-administered medications on file prior to visit.        ROS:  All others reviewed and negative.  Objective        PE:  BP 122/70   Pulse 70   Temp 98.2 F (36.8 C) (Oral)   Ht 5\' 5"  (1.651 m)   Wt 161 lb (73 kg)   SpO2 100%   BMI 26.79 kg/m                 Constitutional: Pt appears in NAD  HENT: Head: NCAT.                Right Ear: External ear normal.                 Left Ear: External ear normal.                Eyes: . Pupils are equal, round, and reactive to light. Conjunctivae and EOM are normal               Nose: without d/c or deformity               Neck: Neck supple. Gross normal ROM               Cardiovascular: Normal rate and regular rhythm.                 Pulmonary/Chest: Effort normal and breath sounds without rales or wheezing.                Abd:  Soft, NT, ND, + BS, no organomegaly               Neurological: Pt is alert. At baseline orientation, motor grossly intact               Skin: Skin is warm. No rashes, no other new lesions, LE edema - none               Psychiatric: Pt behavior is normal without agitation   Micro: none  Cardiac tracings I  have personally interpreted today:  none  Pertinent Radiological findings (summarize): none   Lab Results  Component Value Date   WBC 5.9 12/30/2019   HGB 13.1 12/30/2019   HCT 38.3 12/30/2019   PLT 338 12/30/2019   GLUCOSE 96 06/29/2020   CHOL 256 (H) 06/29/2020   TRIG 91 06/29/2020   HDL 68 06/29/2020   LDLCALC 172 (H) 06/29/2020   ALT 23 12/30/2019   AST 20 12/30/2019   NA 139 06/29/2020   K 3.8 06/29/2020   CL 100 06/29/2020   CREATININE 0.92 06/29/2020   BUN 13 06/29/2020   CO2 25 06/29/2020   TSH 0.975 12/30/2019   HGBA1C 5.7 (H) 12/31/2017   Assessment/Plan:  Gabrielle Long is a 60 y.o. Black or African American [2] female with  has a past medical history of Asthma, Degenerative joint disease, Hyperlipidemia, Hypertension, and Low back pain.  Vitamin D deficiency Last vitamin D Lab Results  Component Value Date   25OHVITD3 38.1 11/03/2015   VD25OH 51.3 06/29/2020   Stable, cont oral replacement   Hyperlipidemia with target LDL less than 160 Lab Results  Component Value Date   LDLCALC 172 (H) 06/29/2020   Severe uncontrolled, delcines statin but will have cardiac CT score,  Encounter for well adult exam with abnormal findings Age and sex appropriate education and counseling updated with regular exercise and diet Referrals for preventative services - none needed Immunizations addressed - covid booster delclines Smoking counseling  - none needed Evidence for depression or other mood disorder - none significant Most recent labs reviewed. I have personally reviewed and have noted: 1) the patient's medical and social history 2) The patient's current medications and supplements 3) The patient's height, weight, and BMI have been recorded in the chart   Asthma Mild intermittent, stable overall , declines need for albuterol hfa prn  Followup: Return in about 1 year (around 10/02/2022) for follow up 1 year with pcp.  Fayrene Fearing  Jonny Ruiz, MD 10/08/2021 10:41 PM [facility]

## 2021-10-02 NOTE — Patient Instructions (Signed)
We have discussed the Cardiac CT Score test to measure the calcification level (if any) in your heart arteries.  This test has been ordered in our Computer System, so please call  CT directly, as they prefer this, at [phone] to be scheduled.  Please continue all other medications as before, and refills have been done if requested.  Please have the pharmacy call with any other refills you may need.  Please continue your efforts at being more active, low cholesterol diet, and weight control.  You are otherwise up to date with prevention measures today.  Please keep your appointments with your specialists as you may have planned  Please have your lab testing done at the [facility]  Please make an Appointment to return for your 1 year visit, or sooner if needed, to Dr Yetta Barre

## 2021-10-03 DIAGNOSIS — Z0000 Encounter for general adult medical examination without abnormal findings: Secondary | ICD-10-CM | POA: Diagnosis not present

## 2021-10-08 ENCOUNTER — Encounter: Payer: Self-pay | Admitting: Internal Medicine

## 2021-10-08 DIAGNOSIS — Z0001 Encounter for general adult medical examination with abnormal findings: Secondary | ICD-10-CM | POA: Insufficient documentation

## 2021-10-08 NOTE — Assessment & Plan Note (Signed)
Mild intermittent, stable overall , declines need for albuterol hfa prn

## 2021-10-08 NOTE — Assessment & Plan Note (Signed)
Lab Results  Component Value Date   LDLCALC 172 (H) 06/29/2020   Severe uncontrolled, delcines statin but will have cardiac CT score,

## 2021-10-08 NOTE — Assessment & Plan Note (Signed)
Age and sex appropriate education and counseling updated with regular exercise and diet Referrals for preventative services - none needed Immunizations addressed - covid booster delclines Smoking counseling  - none needed Evidence for depression or other mood disorder - none significant Most recent labs reviewed. I have personally reviewed and have noted: 1) the patient's medical and social history 2) The patient's current medications and supplements 3) The patient's height, weight, and BMI have been recorded in the chart

## 2021-10-08 NOTE — Assessment & Plan Note (Signed)
Last vitamin D Lab Results  Component Value Date   25OHVITD3 38.1 11/03/2015   VD25OH 51.3 06/29/2020   Stable, cont oral replacement

## 2021-10-20 DIAGNOSIS — K648 Other hemorrhoids: Secondary | ICD-10-CM | POA: Diagnosis not present

## 2021-10-20 DIAGNOSIS — Z86010 Personal history of colonic polyps: Secondary | ICD-10-CM | POA: Diagnosis not present

## 2021-10-20 DIAGNOSIS — K635 Polyp of colon: Secondary | ICD-10-CM | POA: Diagnosis not present

## 2021-10-26 ENCOUNTER — Ambulatory Visit (INDEPENDENT_AMBULATORY_CARE_PROVIDER_SITE_OTHER)
Admission: RE | Admit: 2021-10-26 | Discharge: 2021-10-26 | Disposition: A | Discharge status: Home / Self Care | Payer: Self-pay | Source: Ambulatory Visit | Attending: Internal Medicine | Admitting: Internal Medicine

## 2021-10-26 ENCOUNTER — Other Ambulatory Visit: Payer: Self-pay | Admitting: Internal Medicine

## 2021-10-26 ENCOUNTER — Encounter: Payer: Self-pay | Admitting: Internal Medicine

## 2021-10-26 ENCOUNTER — Other Ambulatory Visit: Payer: Self-pay

## 2021-10-26 DIAGNOSIS — E785 Hyperlipidemia, unspecified: Secondary | ICD-10-CM

## 2021-10-26 MED ORDER — ROSUVASTATIN CALCIUM 20 MG PO TABS: 20.0000 mg | ORAL_TABLET | Freq: Every day | ORAL | 3 refills | Status: DC

## 2022-01-09 DIAGNOSIS — H04123 Dry eye syndrome of bilateral lacrimal glands: Secondary | ICD-10-CM | POA: Diagnosis not present

## 2022-01-09 DIAGNOSIS — H25013 Cortical age-related cataract, bilateral: Secondary | ICD-10-CM | POA: Diagnosis not present

## 2022-02-28 DIAGNOSIS — Z13820 Encounter for screening for osteoporosis: Secondary | ICD-10-CM | POA: Diagnosis not present

## 2022-02-28 DIAGNOSIS — Z01419 Encounter for gynecological examination (general) (routine) without abnormal findings: Secondary | ICD-10-CM | POA: Diagnosis not present

## 2022-02-28 DIAGNOSIS — Z1231 Encounter for screening mammogram for malignant neoplasm of breast: Secondary | ICD-10-CM | POA: Diagnosis not present

## 2022-02-28 DIAGNOSIS — Z6828 Body mass index (BMI) 28.0-28.9, adult: Secondary | ICD-10-CM | POA: Diagnosis not present

## 2022-02-28 DIAGNOSIS — Z124 Encounter for screening for malignant neoplasm of cervix: Secondary | ICD-10-CM | POA: Diagnosis not present

## 2022-03-01 ENCOUNTER — Other Ambulatory Visit: Payer: Self-pay | Admitting: Obstetrics and Gynecology

## 2022-03-01 DIAGNOSIS — R928 Other abnormal and inconclusive findings on diagnostic imaging of breast: Secondary | ICD-10-CM

## 2022-03-13 ENCOUNTER — Ambulatory Visit
Admission: RE | Admit: 2022-03-13 | Discharge: 2022-03-13 | Disposition: A | Discharge status: Home / Self Care | Payer: BC Managed Care – PPO | Source: Ambulatory Visit | Attending: Obstetrics and Gynecology | Admitting: Obstetrics and Gynecology

## 2022-03-13 DIAGNOSIS — R928 Other abnormal and inconclusive findings on diagnostic imaging of breast: Secondary | ICD-10-CM

## 2022-03-13 DIAGNOSIS — R922 Inconclusive mammogram: Secondary | ICD-10-CM | POA: Diagnosis not present

## 2022-03-13 LAB — HM MAMMOGRAPHY

## 2022-03-16 DIAGNOSIS — H6503 Acute serous otitis media, bilateral: Secondary | ICD-10-CM | POA: Diagnosis not present

## 2022-03-16 DIAGNOSIS — J309 Allergic rhinitis, unspecified: Secondary | ICD-10-CM | POA: Diagnosis not present

## 2022-03-16 DIAGNOSIS — J4521 Mild intermittent asthma with (acute) exacerbation: Secondary | ICD-10-CM | POA: Diagnosis not present

## 2022-07-17 DIAGNOSIS — H25013 Cortical age-related cataract, bilateral: Secondary | ICD-10-CM | POA: Diagnosis not present

## 2022-07-17 DIAGNOSIS — I10 Essential (primary) hypertension: Secondary | ICD-10-CM | POA: Diagnosis not present

## 2022-07-17 DIAGNOSIS — H04123 Dry eye syndrome of bilateral lacrimal glands: Secondary | ICD-10-CM | POA: Diagnosis not present

## 2022-10-08 DIAGNOSIS — N3000 Acute cystitis without hematuria: Secondary | ICD-10-CM | POA: Diagnosis not present

## 2022-10-23 ENCOUNTER — Encounter: Payer: Self-pay | Admitting: Internal Medicine

## 2022-10-23 ENCOUNTER — Ambulatory Visit (INDEPENDENT_AMBULATORY_CARE_PROVIDER_SITE_OTHER): Payer: BC Managed Care – PPO | Admitting: Internal Medicine

## 2022-10-23 VITALS — BP 134/86 | HR 76 | Temp 98.4°F | Resp 16 | Ht 65.0 in | Wt 171.0 lb

## 2022-10-23 DIAGNOSIS — E785 Hyperlipidemia, unspecified: Secondary | ICD-10-CM | POA: Diagnosis not present

## 2022-10-23 DIAGNOSIS — I10 Essential (primary) hypertension: Secondary | ICD-10-CM | POA: Diagnosis not present

## 2022-10-23 DIAGNOSIS — E559 Vitamin D deficiency, unspecified: Secondary | ICD-10-CM

## 2022-10-23 DIAGNOSIS — J301 Allergic rhinitis due to pollen: Secondary | ICD-10-CM

## 2022-10-23 DIAGNOSIS — Z0000 Encounter for general adult medical examination without abnormal findings: Secondary | ICD-10-CM

## 2022-10-23 DIAGNOSIS — Z0001 Encounter for general adult medical examination with abnormal findings: Secondary | ICD-10-CM

## 2022-10-23 DIAGNOSIS — Z1231 Encounter for screening mammogram for malignant neoplasm of breast: Secondary | ICD-10-CM

## 2022-10-23 MED ORDER — ROSUVASTATIN CALCIUM 20 MG PO TABS: 20.0000 mg | ORAL_TABLET | Freq: Every day | ORAL | 1 refills | Status: DC

## 2022-10-23 MED ORDER — INDAPAMIDE 1.25 MG PO TABS: 1.2500 mg | ORAL_TABLET | Freq: Every day | ORAL | 1 refills | Status: DC

## 2022-10-23 MED ORDER — FLUTICASONE PROPIONATE 50 MCG/ACT NA SUSP: NASAL | 1 refills | Status: DC

## 2022-10-23 NOTE — Progress Notes (Signed)
Subjective:  Patient ID: Gabrielle Long, female    DOB: 1961/11/08  Age: 61 y.o. MRN: [id number]  CC: Annual Exam, Allergic Rhinitis , Hypertension, and Hyperlipidemia   HPI Gabrielle Long presents for a CPX and f/up -   She is active and denies chest pain, shortness of breath, diaphoresis, or edema.  Outpatient Medications Prior to Visit  Medication Sig Dispense Refill   Cholecalciferol (VITAMIN D3) 5000 UNITS CAPS Take by mouth.       Omega-3 Fatty Acids (FISH OIL) 1000 MG CAPS      fluticasone (FLONASE) 50 MCG/ACT nasal spray SHAKE LIQUID AND USE 2 SPRAYS IN EACH NOSTRIL DAILY 16 g 11   indapamide (LOZOL) 1.25 MG tablet Take 1 tablet (1.25 mg total) by mouth daily. 90 tablet 3   rosuvastatin (CRESTOR) 20 MG tablet Take 1 tablet (20 mg total) by mouth daily. 90 tablet 3   No facility-administered medications prior to visit.    ROS Review of Systems  Constitutional: Negative.  Negative for chills, diaphoresis and fatigue.  HENT: Negative.    Eyes: Negative.   Respiratory:  Negative for cough, chest tightness, shortness of breath and wheezing.   Cardiovascular:  Negative for chest pain, palpitations and leg swelling.  Gastrointestinal:  Negative for abdominal pain, diarrhea, nausea and vomiting.  Endocrine: Negative.   Genitourinary: Negative.  Negative for difficulty urinating.  Musculoskeletal:  Negative for arthralgias and myalgias.  Neurological: Negative.  Negative for dizziness and weakness.  Hematological:  Negative for adenopathy. Does not bruise/bleed easily.  Psychiatric/Behavioral: Negative.      Objective:  BP 134/86 (BP Location: Right Arm, Patient Position: Sitting, Cuff Size: Large)   Pulse 76   Temp 98.4 F (36.9 C) (Oral)   Resp 16   Ht 5\' 5"  (1.651 m)   Wt 171 lb (77.6 kg)   SpO2 99%   BMI 28.46 kg/m   BP Readings from Last 3 Encounters:  10/23/22 134/86  10/02/21 122/70  06/28/20 128/84    Wt Readings from Last 3 Encounters:  10/23/22 171  lb (77.6 kg)  10/02/21 161 lb (73 kg)  06/28/20 163 lb (73.9 kg)    Physical Exam Vitals reviewed.  HENT:     Nose: Nose normal.     Mouth/Throat:     Mouth: Mucous membranes are moist.  Eyes:     General: No scleral icterus.    Conjunctiva/sclera: Conjunctivae normal.  Cardiovascular:     Rate and Rhythm: Normal rate and regular rhythm.     Heart sounds: No murmur heard. Pulmonary:     Effort: Pulmonary effort is normal.     Breath sounds: No stridor. No wheezing, rhonchi or rales.  Abdominal:     General: Abdomen is flat.     Palpations: There is no mass.     Tenderness: There is no abdominal tenderness. There is no guarding.     Hernia: No hernia is present.  Musculoskeletal:        General: Normal range of motion.     Cervical back: Neck supple.     Right lower leg: No edema.     Left lower leg: No edema.  Lymphadenopathy:     Cervical: No cervical adenopathy.  Skin:    General: Skin is warm and dry.  Neurological:     General: No focal deficit present.     Mental Status: She is alert.  Psychiatric:        Mood and Affect: Mood normal.  Behavior: Behavior normal.     Lab Results  Component Value Date   WBC 4.6 10/23/2022   HGB 13.9 10/23/2022   HCT 41.5 10/23/2022   PLT 331 10/23/2022   GLUCOSE 102 (H) 10/23/2022   CHOL 162 10/23/2022   TRIG 59 10/23/2022   HDL 67 10/23/2022   LDLCALC 83 10/23/2022   ALT 26 10/23/2022   AST 22 10/23/2022   NA 141 10/23/2022   K 4.5 10/23/2022   CL 100 10/23/2022   CREATININE 0.93 10/23/2022   BUN 12 10/23/2022   CO2 25 10/23/2022   TSH 1.050 10/23/2022   HGBA1C 5.7 (H) 12/31/2017    MM DIAG BREAST TOMO UNI LEFT  Result Date: 03/13/2022 CLINICAL DATA:  Callback from screening mammogram for possible asymmetry left breast EXAM: DIGITAL DIAGNOSTIC UNILATERAL LEFT MAMMOGRAM WITH TOMOSYNTHESIS AND CAD; ULTRASOUND LEFT BREAST LIMITED TECHNIQUE: Left digital diagnostic mammography and breast tomosynthesis was  performed. The images were evaluated with computer-aided detection.; Targeted ultrasound examination of the left breast was performed. COMPARISON:  Prior films ACR Breast Density Category c: The breast tissue is heterogeneously dense, which may obscure small masses. FINDINGS: Lateral view of left breast, spot compression left MLO view are submitted. Previously questioned asymmetry is persistent on additional views. Targeted ultrasound is performed, showing a normal intramammary lymph node at the left breast 2 o'clock 8 cm from nipple correlating to the mammographic asymmetry. IMPRESSION: Benign findings. RECOMMENDATION: Routine screening mammogram back on schedule. I have discussed the findings and recommendations with the patient. If applicable, a reminder letter will be sent to the patient regarding the next appointment. BI-RADS CATEGORY  2: Benign. Electronically Signed   By: Sherian Rein M.D.   On: 03/13/2022 14:40   US BREAST LTD UNI LEFT INC AXILLA  Result Date: 03/13/2022 CLINICAL DATA:  Callback from screening mammogram for possible asymmetry left breast EXAM: DIGITAL DIAGNOSTIC UNILATERAL LEFT MAMMOGRAM WITH TOMOSYNTHESIS AND CAD; ULTRASOUND LEFT BREAST LIMITED TECHNIQUE: Left digital diagnostic mammography and breast tomosynthesis was performed. The images were evaluated with computer-aided detection.; Targeted ultrasound examination of the left breast was performed. COMPARISON:  Prior films ACR Breast Density Category c: The breast tissue is heterogeneously dense, which may obscure small masses. FINDINGS: Lateral view of left breast, spot compression left MLO view are submitted. Previously questioned asymmetry is persistent on additional views. Targeted ultrasound is performed, showing a normal intramammary lymph node at the left breast 2 o'clock 8 cm from nipple correlating to the mammographic asymmetry. IMPRESSION: Benign findings. RECOMMENDATION: Routine screening mammogram back on schedule. I  have discussed the findings and recommendations with the patient. If applicable, a reminder letter will be sent to the patient regarding the next appointment. BI-RADS CATEGORY  2: Benign. Electronically Signed   By: Sherian Rein M.D.   On: 03/13/2022 14:40   Assessment & Plan:   Gabrielle Long was seen today for annual exam, allergic rhinitis , hypertension and hyperlipidemia.  Diagnoses and all orders for this visit:  Essential hypertension- She has not achieved her blood pressure goal of 130/80.  Will continue the current dose of the thiazide diuretic and she will continue working on her lifestyle modifications. -     indapamide (LOZOL) 1.25 MG tablet; Take 1 tablet (1.25 mg total) by mouth daily. -     Basic metabolic panel; Future -     CBC with Differential/Platelet; Future -     TSH; Future -     Urinalysis, Routine w reflex microscopic; Future -  Hepatic function panel; Future -     Hepatic function panel -     Urinalysis, Routine w reflex microscopic -     TSH -     CBC with Differential/Platelet -     Basic metabolic panel  Vitamin D deficiency -     VITAMIN D 25 Hydroxy (Vit-D Deficiency, Fractures); Future -     VITAMIN D 25 Hydroxy (Vit-D Deficiency, Fractures)  Hyperlipidemia with target LDL less than 160- LDL goal achieved. Doing well on the statin  -     rosuvastatin (CRESTOR) 20 MG tablet; Take 1 tablet (20 mg total) by mouth daily. -     Lipid panel; Future -     TSH; Future -     Hepatic function panel; Future -     Hepatic function panel -     TSH -     Lipid panel  Visit for screening mammogram  Encounter for well adult exam with abnormal findings- Exam completed, labs reviewed, vaccines reviewed and updated, cancer screenings are up-to-date, patient education was given.  Allergic rhinitis due to pollen -     fluticasone (FLONASE) 50 MCG/ACT nasal spray; SHAKE LIQUID AND USE 2 SPRAYS IN EACH NOSTRIL DAILY  Other orders -     Microscopic Examination -      Specimen status report   I am having Gabrielle Long maintain her Vitamin D3, Fish Oil, rosuvastatin, fluticasone, and indapamide.  Meds ordered this encounter  Medications   rosuvastatin (CRESTOR) 20 MG tablet    Sig: Take 1 tablet (20 mg total) by mouth daily.    Dispense:  90 tablet    Refill:  1   fluticasone (FLONASE) 50 MCG/ACT nasal spray    Sig: SHAKE LIQUID AND USE 2 SPRAYS IN EACH NOSTRIL DAILY    Dispense:  48 g    Refill:  1   indapamide (LOZOL) 1.25 MG tablet    Sig: Take 1 tablet (1.25 mg total) by mouth daily.    Dispense:  90 tablet    Refill:  1     Follow-up: Return in about 6 months (around 04/24/2023).  Scarlette Calico, MD

## 2022-10-23 NOTE — Patient Instructions (Signed)

## 2022-10-24 LAB — URINALYSIS, ROUTINE W REFLEX MICROSCOPIC
Bilirubin, UA: NEGATIVE
Glucose, UA: NEGATIVE
Ketones, UA: NEGATIVE
Nitrite, UA: NEGATIVE
Protein,UA: NEGATIVE
RBC, UA: NEGATIVE
Specific Gravity, UA: 1.013 (ref 1.005–1.030)
Urobilinogen, Ur: 1 mg/dL (ref 0.2–1.0)
pH, UA: 7.5 (ref 5.0–7.5)

## 2022-10-24 LAB — LIPID PANEL
Chol/HDL Ratio: 2.4 ratio (ref 0.0–4.4)
Cholesterol, Total: 162 mg/dL (ref 100–199)
HDL: 67 mg/dL (ref >39)
LDL Chol Calc (NIH): 83 mg/dL (ref 0–99)
Triglycerides: 59 mg/dL (ref 0–149)
VLDL Cholesterol Cal: 12 mg/dL (ref 5–40)

## 2022-10-24 LAB — CBC WITH DIFFERENTIAL/PLATELET
Basophils Absolute: 0.1 10*3/uL (ref 0.0–0.2)
Basos: 1 %
EOS (ABSOLUTE): 0.1 10*3/uL (ref 0.0–0.4)
Eos: 3 %
Hematocrit: 41.5 % (ref 34.0–46.6)
Hemoglobin: 13.9 g/dL (ref 11.1–15.9)
Immature Grans (Abs): 0 10*3/uL (ref 0.0–0.1)
Immature Granulocytes: 0 %
Lymphocytes Absolute: 1.7 10*3/uL (ref 0.7–3.1)
Lymphs: 36 %
MCH: 28.6 pg (ref 26.6–33.0)
MCHC: 33.5 g/dL (ref 31.5–35.7)
MCV: 85 fL (ref 79–97)
Monocytes Absolute: 0.4 10*3/uL (ref 0.1–0.9)
Monocytes: 8 %
Neutrophils Absolute: 2.4 10*3/uL (ref 1.4–7.0)
Neutrophils: 52 %
Platelets: 331 10*3/uL (ref 150–450)
RBC: 4.86 x10E6/uL (ref 3.77–5.28)
RDW: 14.1 % (ref 11.7–15.4)
WBC: 4.6 10*3/uL (ref 3.4–10.8)

## 2022-10-24 LAB — BASIC METABOLIC PANEL
BUN/Creatinine Ratio: 13 (ref 12–28)
BUN: 12 mg/dL (ref 8–27)
CO2: 25 mmol/L (ref 20–29)
Calcium: 10 mg/dL (ref 8.7–10.3)
Chloride: 100 mmol/L (ref 96–106)
Creatinine, Ser: 0.93 mg/dL (ref 0.57–1.00)
Glucose: 102 mg/dL — ABNORMAL HIGH (ref 70–99)
Potassium: 4.5 mmol/L (ref 3.5–5.2)
Sodium: 141 mmol/L (ref 134–144)
eGFR: 70 mL/min/{1.73_m2} (ref >59)

## 2022-10-24 LAB — MICROSCOPIC EXAMINATION
Casts: NONE SEEN /lpf
RBC, Urine: NONE SEEN /hpf (ref 0–2)

## 2022-10-24 LAB — HEPATIC FUNCTION PANEL
ALT: 26 IU/L (ref 0–32)
AST: 22 IU/L (ref 0–40)
Albumin: 5 g/dL — ABNORMAL HIGH (ref 3.8–4.9)
Alkaline Phosphatase: 69 IU/L (ref 44–121)
Bilirubin Total: 0.4 mg/dL (ref 0.0–1.2)
Bilirubin, Direct: 0.13 mg/dL (ref 0.00–0.40)
Total Protein: 7.7 g/dL (ref 6.0–8.5)

## 2022-10-24 LAB — TSH: TSH: 1.05 u[IU]/mL (ref 0.450–4.500)

## 2022-10-24 LAB — VITAMIN D 25 HYDROXY (VIT D DEFICIENCY, FRACTURES): Vit D, 25-Hydroxy: 70.5 ng/mL (ref 30.0–100.0)

## 2022-10-24 LAB — SPECIMEN STATUS REPORT

## 2023-03-25 DIAGNOSIS — Z1231 Encounter for screening mammogram for malignant neoplasm of breast: Secondary | ICD-10-CM | POA: Diagnosis not present

## 2023-03-25 DIAGNOSIS — Z01419 Encounter for gynecological examination (general) (routine) without abnormal findings: Secondary | ICD-10-CM | POA: Diagnosis not present

## 2023-03-25 DIAGNOSIS — Z6827 Body mass index (BMI) 27.0-27.9, adult: Secondary | ICD-10-CM | POA: Diagnosis not present

## 2023-05-06 ENCOUNTER — Ambulatory Visit: Payer: BC Managed Care – PPO | Admitting: Internal Medicine

## 2023-05-06 ENCOUNTER — Other Ambulatory Visit: Payer: Self-pay | Admitting: Internal Medicine

## 2023-05-06 ENCOUNTER — Encounter: Payer: Self-pay | Admitting: Internal Medicine

## 2023-05-06 VITALS — BP 126/82 | HR 76 | Temp 98.1°F | Ht 65.0 in | Wt 168.0 lb

## 2023-05-06 DIAGNOSIS — I10 Essential (primary) hypertension: Secondary | ICD-10-CM

## 2023-05-06 DIAGNOSIS — J301 Allergic rhinitis due to pollen: Secondary | ICD-10-CM

## 2023-05-06 DIAGNOSIS — N1831 Chronic kidney disease, stage 3a: Secondary | ICD-10-CM

## 2023-05-06 DIAGNOSIS — R3129 Other microscopic hematuria: Secondary | ICD-10-CM | POA: Diagnosis not present

## 2023-05-06 DIAGNOSIS — E785 Hyperlipidemia, unspecified: Secondary | ICD-10-CM

## 2023-05-06 MED ORDER — ROSUVASTATIN CALCIUM 20 MG PO TABS: 20.0000 mg | ORAL_TABLET | Freq: Every day | ORAL | 1 refills | Status: DC

## 2023-05-06 MED ORDER — INDAPAMIDE 1.25 MG PO TABS: 1.2500 mg | ORAL_TABLET | Freq: Every day | ORAL | 1 refills | Status: DC

## 2023-05-06 MED ORDER — FLUTICASONE PROPIONATE 50 MCG/ACT NA SUSP: NASAL | 1 refills | Status: AC

## 2023-05-06 NOTE — Progress Notes (Unsigned)
Subjective:  Patient ID: Gabrielle Long, female    DOB: [birth year]  Age: 61 y.o. MRN: [id number]  CC: No chief complaint on file.   HPI Gabrielle Long presents for ***  Discussed the use of AI scribe software for clinical note transcription with the patient, who gave verbal consent to proceed.  History of Present Illness   The patient, with a history of hypertension, presented for a routine follow-up. They reported no symptoms related to their hypertension, such as dizziness or lightheadedness, and felt their blood pressure was well-controlled. They also denied any chest pain or shortness of breath, even when engaging in physical activities such as power walking in a [place] neighborhood.  The patient also reported trace blood in their urine, discovered during a routine urinalysis at a recent OB-GYN visit. Despite this finding, they experienced no urinary symptoms such as urgency, frequency, or dysuria. They were unaware of a prescription for Bactrim, an antibiotic typically used for UTIs, that had been sent to their pharmacy following the urinalysis.  In addition, the patient reported a history of foot cramping, which had improved with the implementation of yoga techniques. They also mentioned a previous ankle injury from 2021, which still caused periodic swelling, particularly during air travel. To manage this, they used compression socks.  Lastly, the patient mentioned a history of degenerative disc disease, which they have been managing for several years. They did not report any new or worsening symptoms related to this condition.       Outpatient Medications Prior to Visit  Medication Sig Dispense Refill   Cholecalciferol (VITAMIN D3) 5000 UNITS CAPS Take by mouth.       fluticasone (FLONASE) 50 MCG/ACT nasal spray SHAKE LIQUID AND USE 2 SPRAYS IN EACH NOSTRIL DAILY 48 g 1   indapamide (LOZOL) 1.25 MG tablet Take 1 tablet (1.25 mg total) by mouth daily. 90 tablet 1   rosuvastatin  (CRESTOR) 20 MG tablet Take 1 tablet (20 mg total) by mouth daily. 90 tablet 1   Omega-3 Fatty Acids (FISH OIL) 1000 MG CAPS      No facility-administered medications prior to visit.    ROS Review of Systems  Objective:  BP 126/82 (BP Location: Left Arm, Patient Position: Sitting, Cuff Size: Large)   Pulse 76   Temp 98.1 F (36.7 C) (Oral)   Ht 5\' 5"  (1.651 m)   Wt 168 lb (76.2 kg)   SpO2 97%   BMI 27.96 kg/m   BP Readings from Last 3 Encounters:  05/06/23 126/82  10/23/22 134/86  10/02/21 122/70    Wt Readings from Last 3 Encounters:  05/06/23 168 lb (76.2 kg)  10/23/22 171 lb (77.6 kg)  10/02/21 161 lb (73 kg)    Physical Exam  Lab Results  Component Value Date   WBC 4.6 10/23/2022   HGB 13.9 10/23/2022   HCT 41.5 10/23/2022   PLT 331 10/23/2022   GLUCOSE 102 (H) 10/23/2022   CHOL 162 10/23/2022   TRIG 59 10/23/2022   HDL 67 10/23/2022   LDLCALC 83 10/23/2022   ALT 26 10/23/2022   AST 22 10/23/2022   NA 141 10/23/2022   K 4.5 10/23/2022   CL 100 10/23/2022   CREATININE 0.93 10/23/2022   BUN 12 10/23/2022   CO2 25 10/23/2022   TSH 1.050 10/23/2022   HGBA1C 5.7 (H) 12/31/2017    MM DIAG BREAST TOMO UNI LEFT  Result Date: 03/13/2022 CLINICAL DATA:  Callback from screening mammogram for possible asymmetry left breast  EXAM: DIGITAL DIAGNOSTIC UNILATERAL LEFT MAMMOGRAM WITH TOMOSYNTHESIS AND CAD; ULTRASOUND LEFT BREAST LIMITED TECHNIQUE: Left digital diagnostic mammography and breast tomosynthesis was performed. The images were evaluated with computer-aided detection.; Targeted ultrasound examination of the left breast was performed. COMPARISON:  Prior films ACR Breast Density Category c: The breast tissue is heterogeneously dense, which may obscure small masses. FINDINGS: Lateral view of left breast, spot compression left MLO view are submitted. Previously questioned asymmetry is persistent on additional views. Targeted ultrasound is performed, showing a normal  intramammary lymph node at the left breast 2 o'clock 8 cm from nipple correlating to the mammographic asymmetry. IMPRESSION: Benign findings. RECOMMENDATION: Routine screening mammogram back on schedule. I have discussed the findings and recommendations with the patient. If applicable, a reminder letter will be sent to the patient regarding the next appointment. BI-RADS CATEGORY  2: Benign. Electronically Signed   By: Sherian Rein M.D.   On: 03/13/2022 14:40   US BREAST LTD UNI LEFT INC AXILLA  Result Date: 03/13/2022 CLINICAL DATA:  Callback from screening mammogram for possible asymmetry left breast EXAM: DIGITAL DIAGNOSTIC UNILATERAL LEFT MAMMOGRAM WITH TOMOSYNTHESIS AND CAD; ULTRASOUND LEFT BREAST LIMITED TECHNIQUE: Left digital diagnostic mammography and breast tomosynthesis was performed. The images were evaluated with computer-aided detection.; Targeted ultrasound examination of the left breast was performed. COMPARISON:  Prior films ACR Breast Density Category c: The breast tissue is heterogeneously dense, which may obscure small masses. FINDINGS: Lateral view of left breast, spot compression left MLO view are submitted. Previously questioned asymmetry is persistent on additional views. Targeted ultrasound is performed, showing a normal intramammary lymph node at the left breast 2 o'clock 8 cm from nipple correlating to the mammographic asymmetry. IMPRESSION: Benign findings. RECOMMENDATION: Routine screening mammogram back on schedule. I have discussed the findings and recommendations with the patient. If applicable, a reminder letter will be sent to the patient regarding the next appointment. BI-RADS CATEGORY  2: Benign. Electronically Signed   By: Sherian Rein M.D.   On: 03/13/2022 14:40   Assessment & Plan:  Allergic rhinitis due to pollen -     Fluticasone Propionate; SHAKE LIQUID AND USE 2 SPRAYS IN EACH NOSTRIL DAILY  Dispense: 48 g; Refill: 1  Essential hypertension -     Indapamide;  Take 1 tablet (1.25 mg total) by mouth daily.  Dispense: 90 tablet; Refill: 1  Hyperlipidemia with target LDL less than 160 -     Rosuvastatin Calcium; Take 1 tablet (20 mg total) by mouth daily.  Dispense: 90 tablet; Refill: 1     Follow-up: No follow-ups on file.  Sanda Linger, MD

## 2023-05-06 NOTE — Patient Instructions (Signed)

## 2023-05-07 DIAGNOSIS — J301 Allergic rhinitis due to pollen: Secondary | ICD-10-CM | POA: Insufficient documentation

## 2023-05-07 DIAGNOSIS — R3129 Other microscopic hematuria: Secondary | ICD-10-CM | POA: Insufficient documentation

## 2023-05-07 DIAGNOSIS — N1831 Chronic kidney disease, stage 3a: Secondary | ICD-10-CM | POA: Insufficient documentation

## 2023-05-07 LAB — URINALYSIS, ROUTINE W REFLEX MICROSCOPIC

## 2023-05-07 LAB — MICROSCOPIC EXAMINATION

## 2023-05-07 LAB — CBC WITH DIFFERENTIAL/PLATELET
Basos: 1 %
MCHC: 33.4 g/dL (ref 31.5–35.7)
Neutrophils: 42 %

## 2023-05-07 LAB — BASIC METABOLIC PANEL
BUN/Creatinine Ratio: 12 (ref 12–28)
Chloride: 97 mmol/L (ref 96–106)
Glucose: 82 mg/dL (ref 70–99)

## 2023-05-08 LAB — URINALYSIS, ROUTINE W REFLEX MICROSCOPIC
Bilirubin, UA: NEGATIVE
Glucose, UA: NEGATIVE
Nitrite, UA: NEGATIVE
Protein,UA: NEGATIVE
Specific Gravity, UA: <=1.005 — AB (ref 1.005–1.030)

## 2023-05-08 LAB — CBC WITH DIFFERENTIAL/PLATELET
Immature Grans (Abs): 0 10*3/uL (ref 0.0–0.1)
Lymphs: 43 %
MCV: 85 fL (ref 79–97)
RBC: 4.99 x10E6/uL (ref 3.77–5.28)

## 2023-05-08 LAB — BASIC METABOLIC PANEL: BUN: 12 mg/dL (ref 8–27)

## 2023-05-09 LAB — BASIC METABOLIC PANEL
Calcium: 10.2 mg/dL (ref 8.7–10.3)
Potassium: 3.9 mmol/L (ref 3.5–5.2)

## 2023-05-09 LAB — URINALYSIS, ROUTINE W REFLEX MICROSCOPIC
RBC, UA: NEGATIVE
pH, UA: 7 (ref 5.0–7.5)

## 2023-05-09 LAB — CBC WITH DIFFERENTIAL/PLATELET
Immature Granulocytes: 0 %
Lymphocytes Absolute: 2.3 10*3/uL (ref 0.7–3.1)
MCH: 28.3 pg (ref 26.6–33.0)
Monocytes: 9 %

## 2023-05-09 LAB — MICROSCOPIC EXAMINATION: RBC, Urine: NONE SEEN /hpf (ref 0–2)

## 2023-05-11 LAB — CBC WITH DIFFERENTIAL/PLATELET
Basophils Absolute: 0.1 10*3/uL (ref 0.0–0.2)
EOS (ABSOLUTE): 0.2 10*3/uL (ref 0.0–0.4)
Eos: 5 %
Hematocrit: 42.2 % (ref 34.0–46.6)
Hemoglobin: 14.1 g/dL (ref 11.1–15.9)
Monocytes Absolute: 0.5 10*3/uL (ref 0.1–0.9)
Neutrophils Absolute: 2.2 10*3/uL (ref 1.4–7.0)
Platelets: 315 10*3/uL (ref 150–450)
RDW: 14.4 % (ref 11.7–15.4)
WBC: 5.2 10*3/uL (ref 3.4–10.8)

## 2023-05-11 LAB — BASIC METABOLIC PANEL
CO2: 24 mmol/L (ref 20–29)
Creatinine, Ser: 1.01 mg/dL — ABNORMAL HIGH (ref 0.57–1.00)
Sodium: 140 mmol/L (ref 134–144)
eGFR: 63 mL/min/{1.73_m2} (ref >59)

## 2023-05-11 LAB — URINALYSIS, ROUTINE W REFLEX MICROSCOPIC
Ketones, UA: NEGATIVE
Urobilinogen, Ur: 0.2 mg/dL (ref 0.2–1.0)

## 2023-05-11 LAB — MICROSCOPIC EXAMINATION: Casts: NONE SEEN /LPF

## 2023-05-11 LAB — CULTURE, URINE COMPREHENSIVE

## 2023-06-10 DIAGNOSIS — M79672 Pain in left foot: Secondary | ICD-10-CM | POA: Diagnosis not present

## 2023-07-03 LAB — HM MAMMOGRAPHY

## 2023-07-04 ENCOUNTER — Encounter (INDEPENDENT_AMBULATORY_CARE_PROVIDER_SITE_OTHER): Payer: Self-pay

## 2023-08-05 ENCOUNTER — Ambulatory Visit: Payer: BC Managed Care – PPO | Admitting: Internal Medicine

## 2023-08-05 ENCOUNTER — Other Ambulatory Visit: Payer: Self-pay | Admitting: Internal Medicine

## 2023-08-05 ENCOUNTER — Encounter: Payer: Self-pay | Admitting: Internal Medicine

## 2023-08-05 VITALS — BP 124/84 | HR 76 | Temp 97.7°F | Ht 65.0 in | Wt 165.0 lb

## 2023-08-05 DIAGNOSIS — B351 Tinea unguium: Secondary | ICD-10-CM | POA: Diagnosis not present

## 2023-08-05 DIAGNOSIS — Z23 Encounter for immunization: Secondary | ICD-10-CM

## 2023-08-05 DIAGNOSIS — N182 Chronic kidney disease, stage 2 (mild): Secondary | ICD-10-CM | POA: Diagnosis not present

## 2023-08-05 DIAGNOSIS — E785 Hyperlipidemia, unspecified: Secondary | ICD-10-CM

## 2023-08-05 DIAGNOSIS — I10 Essential (primary) hypertension: Secondary | ICD-10-CM | POA: Diagnosis not present

## 2023-08-05 NOTE — Patient Instructions (Signed)
Chronic Kidney Disease, Adult Chronic kidney disease (CKD) occurs when the kidneys are slowly and permanently damaged over a long period of time. The kidneys are a pair of organs that do many important jobs in the body, including: Removing waste and extra fluid from the blood to make urine. Making hormones that maintain the amount of fluid in tissues and blood vessels. Maintaining the right amount of fluids and chemicals in the body. A small amount of kidney damage may not cause problems, but a large amount of damage may make it hard or impossible for the kidneys to work right. Steps must be taken to slow kidney damage or to stop it from getting worse. If steps are not taken, the kidneys may stop working permanently (end-stage renal disease, or ESRD). Most of the time, CKD does not go away, but it can often be controlled. People who have CKD are usually able to live full lives. What are the causes? The most common causes of this condition are diabetes and high blood pressure (hypertension). Other causes include: Cardiovascular diseases. These affect the heart and blood vessels. Kidney diseases. These include: Glomerulonephritis, or inflammation of the tiny filters in the kidneys. Interstitial nephritis. This is swelling of the small tubes of the kidneys and of the surrounding structures. Polycystic kidney disease, in which clusters of fluid-filled sacs form within the kidneys. Renal vascular disease. This includes disorders that affect the arteries and veins of the kidneys. Diseases that affect the body's defense system (immune system). A problem with urine flow. This may be caused by: Kidney stones. Cancer. An enlarged prostate, in males. A kidney infection or urinary tract infection (UTI) that keeps coming back. Vasculitis. This is swelling or inflammation of the blood vessels. What increases the risk? Your chances of having kidney disease increase with age. The following factors may make  you more likely to develop this condition: A family history of kidney disease or kidney failure. Kidney failure means the kidneys can no longer work right. Certain genetic diseases. Taking medicines often that are damaging to the kidneys. Being around or being in contact with toxic substances. Obesity. A history of tobacco use. What are the signs or symptoms? Symptoms of this condition include: Feeling very tired (lethargic) and having less energy. Swelling, or edema, of the face, legs, ankles, or feet. Nausea or vomiting, or loss of appetite. Confusion or trouble concentrating. Muscle twitches and cramps, especially in the legs. Dry, itchy skin. A metallic taste in the mouth. Producing less urine, or producing more urine (especially at night). Shortness of breath. Trouble sleeping. CKD may also result in not having enough red blood cells or hemoglobin in the blood (anemia) or having weak bones (bone disease). Symptoms develop slowly and may not be obvious until the kidney damage becomes severe. It is possible to have kidney disease for years without having symptoms. How is this diagnosed? This condition may be diagnosed based on: Blood tests. Urine tests. Imaging tests, such as an ultrasound or a CT scan. A kidney biopsy. This involves removing a sample of kidney tissue to be looked at under a microscope. Results from these tests will help to determine how serious the CKD is. How is this treated? There is no cure for most cases of this condition, but treatment usually relieves symptoms and prevents or slows the worsening of the disease. Treatment may include: Diet changes, which may require you to avoid alcohol and foods that are high in salt, potassium, phosphorous, and protein. Medicines. These may:  Lower blood pressure. Control blood sugar (glucose). Relieve anemia. Relieve swelling. Protect your bones. Improve the balance of salts and minerals in your blood  (electrolytes). Dialysis, which is a type of treatment that removes toxic waste from the body. It may be needed if you have kidney failure. Managing any other conditions that are causing your CKD or making it worse. Follow these instructions at home: Medicines Take over-the-counter and prescription medicines only as told by your health care provider. The amount of some medicines that you take may need to be changed. Do not take any new medicines unless approved by your health care provider. Many medicines can make kidney damage worse. Do not take any vitamin and mineral supplements unless approved by your health care provider. Many nutritional supplements can make kidney damage worse. Lifestyle  Do not use any products that contain nicotine or tobacco, such as cigarettes, e-cigarettes, and chewing tobacco. If you need help quitting, ask your health care provider. If you drink alcohol: Limit how much you use to: 0-1 drink a day for women who are not pregnant. 0-2 drinks a day for men. Know how much alcohol is in your drink. In the [place] one drink equals one 12 oz bottle of beer (355 mL), one 5 oz glass of wine (148 mL), or one 1 oz glass of hard liquor (44 mL). Maintain a healthy weight. If you need help, ask your health care provider. General instructions  Follow instructions from your health care provider about eating or drinking restrictions, including any prescribed diet. Track your blood pressure at home. Report changes in your blood pressure as told. If you are being treated for diabetes, track your blood glucose levels as told. Start or continue an exercise plan. Exercise at least 30 minutes a day, 5 days a week. Keep your immunizations up to date as told. Keep all follow-up visits. This is important. Where to find more information American Association of Kidney Patients: [employer]: www.kidney.org [employer]: [employer]ee Life Options:  www.lifeoptions.org [employer]ney [employer]: www.kidneyschool.org Contact a health care provider if: Your symptoms get worse. You develop new symptoms. Get help right away if: You develop symptoms of ESRD. These include: Headaches. Numbness in your hands or feet. Easy bruising. Frequent hiccups. Chest pain. Shortness of breath. Lack of menstrual periods, in women. You have a fever. You are producing less urine than usual. You have pain or bleeding when you urinate or when you have a bowel movement. These symptoms may represent a serious problem that is an emergency. Do not wait to see if the symptoms will go away. Get medical help right away. Call your local emergency services (911 in the [place] Do not drive yourself to the hospital. Summary Chronic kidney disease (CKD) occurs when the kidneys become damaged slowly over a long period of time. The most common causes of this condition are diabetes and high blood pressure (hypertension). There is no cure for most cases of CKD, but treatment usually relieves symptoms and prevents or slows the worsening of the disease. Treatment may include a combination of lifestyle changes, medicines, and dialysis. This information is not intended to replace advice given to you by your health care provider. Make sure you discuss any questions you have with your health care provider. Document Revised: 02/05/2020 Document Reviewed: 02/10/2020 Elsevier Patient Education  2024 [employer].

## 2023-08-05 NOTE — Progress Notes (Unsigned)
Subjective:  Patient ID: Gabrielle Long, female    DOB: 1961-08-02  Age: 62 y.o. MRN: [id number]  CC: Hypertension and Hyperlipidemia   HPI Kahmari Arakelian presents for f/up ----  Discussed the use of AI scribe software for clinical note transcription with the patient, who gave verbal consent to proceed.  History of Present Illness   The patient, with a history of taking rosuvastatin and recently discontinued it, presents with concerns about decreased kidney function and changes in their toenails. They deny any symptoms of kidney disease such as nausea, vomiting, diarrhea, or abdominal pain.  The patient noticed a darkening of the toenails on both feet, with the change on one foot occurring over a year ago and the other in July. They report no prior issues with their toenails before these changes. The patient has been monitoring these changes closely, expressing concern about the rapid transformation observed in July.       Outpatient Medications Prior to Visit  Medication Sig Dispense Refill   Cholecalciferol (VITAMIN D3) 5000 UNITS CAPS Take by mouth.       fluticasone (FLONASE) 50 MCG/ACT nasal spray SHAKE LIQUID AND USE 2 SPRAYS IN EACH NOSTRIL DAILY 48 g 1   indapamide (LOZOL) 1.25 MG tablet Take 1 tablet (1.25 mg total) by mouth daily. 90 tablet 1   rosuvastatin (CRESTOR) 20 MG tablet Take 1 tablet (20 mg total) by mouth daily. 90 tablet 1   No facility-administered medications prior to visit.    ROS Review of Systems  Objective:  BP 124/84 (BP Location: Left Arm, Patient Position: Sitting, Cuff Size: Large)   Pulse 76   Temp 97.7 F (36.5 C) (Oral)   Ht 5\' 5"  (1.651 m)   Wt 165 lb (74.8 kg)   SpO2 98%   BMI 27.46 kg/m   BP Readings from Last 3 Encounters:  08/05/23 124/84  05/06/23 126/82  10/23/22 134/86    Wt Readings from Last 3 Encounters:  08/05/23 165 lb (74.8 kg)  05/06/23 168 lb (76.2 kg)  10/23/22 171 lb (77.6 kg)    Physical Exam  Lab  Results  Component Value Date   WBC 5.2 05/06/2023   HGB 14.1 05/06/2023   HCT 42.2 05/06/2023   PLT 315 05/06/2023   GLUCOSE 82 05/06/2023   CHOL 162 10/23/2022   TRIG 59 10/23/2022   HDL 67 10/23/2022   LDLCALC 83 10/23/2022   ALT 26 10/23/2022   AST 22 10/23/2022   NA 140 05/06/2023   K 3.9 05/06/2023   CL 97 05/06/2023   CREATININE 1.01 (H) 05/06/2023   BUN 12 05/06/2023   CO2 24 05/06/2023   TSH 1.050 10/23/2022   HGBA1C 5.7 (H) 12/31/2017    MM DIAG BREAST TOMO UNI LEFT  Result Date: 03/13/2022 CLINICAL DATA:  Callback from screening mammogram for possible asymmetry left breast EXAM: DIGITAL DIAGNOSTIC UNILATERAL LEFT MAMMOGRAM WITH TOMOSYNTHESIS AND CAD; ULTRASOUND LEFT BREAST LIMITED TECHNIQUE: Left digital diagnostic mammography and breast tomosynthesis was performed. The images were evaluated with computer-aided detection.; Targeted ultrasound examination of the left breast was performed. COMPARISON:  Prior films ACR Breast Density Category c: The breast tissue is heterogeneously dense, which may obscure small masses. FINDINGS: Lateral view of left breast, spot compression left MLO view are submitted. Previously questioned asymmetry is persistent on additional views. Targeted ultrasound is performed, showing a normal intramammary lymph node at the left breast 2 o'clock 8 cm from nipple correlating to the mammographic asymmetry. IMPRESSION:  Benign findings. RECOMMENDATION: Routine screening mammogram back on schedule. I have discussed the findings and recommendations with the patient. If applicable, a reminder letter will be sent to the patient regarding the next appointment. BI-RADS CATEGORY  2: Benign. Electronically Signed   By: Sherian Rein M.D.   On: 03/13/2022 14:40   US BREAST LTD UNI LEFT INC AXILLA  Result Date: 03/13/2022 CLINICAL DATA:  Callback from screening mammogram for possible asymmetry left breast EXAM: DIGITAL DIAGNOSTIC UNILATERAL LEFT MAMMOGRAM WITH  TOMOSYNTHESIS AND CAD; ULTRASOUND LEFT BREAST LIMITED TECHNIQUE: Left digital diagnostic mammography and breast tomosynthesis was performed. The images were evaluated with computer-aided detection.; Targeted ultrasound examination of the left breast was performed. COMPARISON:  Prior films ACR Breast Density Category c: The breast tissue is heterogeneously dense, which may obscure small masses. FINDINGS: Lateral view of left breast, spot compression left MLO view are submitted. Previously questioned asymmetry is persistent on additional views. Targeted ultrasound is performed, showing a normal intramammary lymph node at the left breast 2 o'clock 8 cm from nipple correlating to the mammographic asymmetry. IMPRESSION: Benign findings. RECOMMENDATION: Routine screening mammogram back on schedule. I have discussed the findings and recommendations with the patient. If applicable, a reminder letter will be sent to the patient regarding the next appointment. BI-RADS CATEGORY  2: Benign. Electronically Signed   By: Sherian Rein M.D.   On: 03/13/2022 14:40   Assessment & Plan:  CKD (chronic kidney disease) stage 2, GFR 60-89 ml/min -     Urinalysis, Routine w reflex microscopic; Future -     Basic metabolic panel -     Hepatic function panel  Flu vaccine need -     Flu vaccine trivalent PF, 6mos and older(Flulaval,Afluria,Fluarix,Fluzone)  Onychomycosis of toenail -     Hepatic function panel  Essential hypertension -     Hepatic function panel  Hyperlipidemia with target LDL less than 160 -     Hepatic function panel     Follow-up: Return in about 4 months (around 12/05/2023).  Sanda Linger, MD

## 2023-08-06 DIAGNOSIS — B351 Tinea unguium: Secondary | ICD-10-CM | POA: Insufficient documentation

## 2023-08-06 DIAGNOSIS — Z23 Encounter for immunization: Secondary | ICD-10-CM | POA: Insufficient documentation

## 2023-08-06 LAB — HEPATIC FUNCTION PANEL
ALT: 28 IU/L (ref 0–32)
AST: 22 IU/L (ref 0–40)
Albumin: 4.6 g/dL (ref 3.9–4.9)
Alkaline Phosphatase: 70 IU/L (ref 44–121)
Bilirubin Total: 0.5 mg/dL (ref 0.0–1.2)
Bilirubin, Direct: 0.11 mg/dL (ref 0.00–0.40)
Total Protein: 7.5 g/dL (ref 6.0–8.5)

## 2023-08-06 LAB — MICROSCOPIC EXAMINATION
Bacteria, UA: NONE SEEN
Casts: NONE SEEN /LPF

## 2023-08-06 LAB — URINALYSIS, ROUTINE W REFLEX MICROSCOPIC
Bilirubin, UA: NEGATIVE
Glucose, UA: NEGATIVE
Ketones, UA: NEGATIVE
Nitrite, UA: NEGATIVE
Protein,UA: NEGATIVE
RBC, UA: NEGATIVE
Specific Gravity, UA: 1.007 (ref 1.005–1.030)
Urobilinogen, Ur: 0.2 mg/dL (ref 0.2–1.0)
pH, UA: 7 (ref 5.0–7.5)

## 2023-08-06 LAB — BASIC METABOLIC PANEL
BUN/Creatinine Ratio: 13 (ref 12–28)
BUN: 13 mg/dL (ref 8–27)
CO2: 25 mmol/L (ref 20–29)
Calcium: 10.2 mg/dL (ref 8.7–10.3)
Chloride: 98 mmol/L (ref 96–106)
Creatinine, Ser: 1.01 mg/dL — ABNORMAL HIGH (ref 0.57–1.00)
Glucose: 99 mg/dL (ref 70–99)
Potassium: 3.7 mmol/L (ref 3.5–5.2)
Sodium: 139 mmol/L (ref 134–144)
eGFR: 63 mL/min/{1.73_m2} (ref >59)

## 2023-08-06 MED ORDER — TERBINAFINE HCL 250 MG PO TABS
250.0000 mg | ORAL_TABLET | Freq: Every day | ORAL | 0 refills | Status: DC
Start: 2023-08-06 — End: 2024-07-01

## 2024-01-15 ENCOUNTER — Ambulatory Visit: Payer: BC Managed Care – PPO | Admitting: Internal Medicine

## 2024-01-15 ENCOUNTER — Encounter: Payer: Self-pay | Admitting: Internal Medicine

## 2024-01-15 VITALS — BP 130/86 | HR 80 | Temp 97.7°F | Resp 16 | Ht 65.0 in | Wt 163.8 lb

## 2024-01-15 DIAGNOSIS — I10 Essential (primary) hypertension: Secondary | ICD-10-CM

## 2024-01-15 DIAGNOSIS — N182 Chronic kidney disease, stage 2 (mild): Secondary | ICD-10-CM

## 2024-01-15 DIAGNOSIS — E785 Hyperlipidemia, unspecified: Secondary | ICD-10-CM | POA: Diagnosis not present

## 2024-01-15 DIAGNOSIS — E559 Vitamin D deficiency, unspecified: Secondary | ICD-10-CM | POA: Diagnosis not present

## 2024-01-15 DIAGNOSIS — N1831 Chronic kidney disease, stage 3a: Secondary | ICD-10-CM

## 2024-01-15 DIAGNOSIS — R9431 Abnormal electrocardiogram [ECG] [EKG]: Secondary | ICD-10-CM | POA: Insufficient documentation

## 2024-01-15 DIAGNOSIS — B351 Tinea unguium: Secondary | ICD-10-CM

## 2024-01-15 NOTE — Progress Notes (Unsigned)
 Subjective:  Patient ID: Gabrielle Long, female    DOB: Mar 31, 1961  Age: 63 y.o. MRN: [id number]  CC: Hypertension      HPI Gabrielle Long presents for f/up ----  Discussed the use of AI scribe software for clinical note transcription with the patient, who gave verbal consent to proceed.  History of Present Illness   Gabrielle Long is a 63 year old female who presents for a selenium level check and follow-up on cholesterol and kidney function.  She is concerned about potential selenium toxicity due to her consumption of [place] nuts, which she has been taking to help manage her cholesterol. She has never had her selenium level checked before.  She has a history of elevated cholesterol and is interested in having her cholesterol levels checked during this visit. She has been consuming [place] nuts as a natural remedy to manage her cholesterol levels.  She mentions a previous decline in kidney function as shown in her last test and is interested in having her kidney function re-evaluated. She agrees to provide a urine specimen for further assessment.  She has not taken her blood pressure medication today as she forgot it at home. She has been monitoring her blood pressure regularly, with recent readings being normal, such as 117/74 during her recent travels to [place]. No symptoms of asthma, chest pain, or shortness of breath during physical activity.  She mentions a history of toe fungus for which she was previously prescribed medication. The condition cleared one toe, but she now experiences foot pain on another toe. The nail had broken off, regrown, and broken again.       Outpatient Medications Prior to Visit  Medication Sig Dispense Refill   Cholecalciferol (VITAMIN D3) 5000 UNITS CAPS Take by mouth.       fluticasone (FLONASE) 50 MCG/ACT nasal spray SHAKE LIQUID AND USE 2 SPRAYS IN EACH NOSTRIL DAILY 48 g 1   terbinafine (LAMISIL) 250 MG tablet Take 1 tablet (250 mg total) by mouth  daily. 90 tablet 0   indapamide (LOZOL) 1.25 MG tablet Take 1 tablet (1.25 mg total) by mouth daily. 90 tablet 1   No facility-administered medications prior to visit.    ROS Review of Systems  Objective:  BP 130/86 (BP Location: Left Arm, Patient Position: Sitting, Cuff Size: Normal)   Pulse 80   Temp 97.7 F (36.5 C) (Oral)   Resp 16   Ht 5\' 5"  (1.651 m)   Wt 163 lb 12.8 oz (74.3 kg)   SpO2 99%   BMI 27.26 kg/m   BP Readings from Last 3 Encounters:  01/15/24 130/86  08/05/23 124/84  05/06/23 126/82    Wt Readings from Last 3 Encounters:  01/15/24 163 lb 12.8 oz (74.3 kg)  08/05/23 165 lb (74.8 kg)  05/06/23 168 lb (76.2 kg)    Physical Exam Vitals reviewed.  Constitutional:      Appearance: Normal appearance.  HENT:     Nose: Nose normal.     Mouth/Throat:     Mouth: Mucous membranes are moist.  Eyes:     General: No scleral icterus.    Conjunctiva/sclera: Conjunctivae normal.  Cardiovascular:     Rate and Rhythm: Normal rate and regular rhythm.     Heart sounds: No murmur heard.    No friction rub. No gallop.     Comments: EKG---  NSR, 78 bpm NS T wave changes Prolonged QT - new No LVH or Q waves Pulmonary:     Effort:  Pulmonary effort is normal.     Breath sounds: No stridor. No wheezing, rhonchi or rales.  Abdominal:     General: Abdomen is flat.     Palpations: There is no mass.     Tenderness: There is no abdominal tenderness. There is no guarding.     Hernia: No hernia is present.  Musculoskeletal:     Cervical back: Neck supple.     Right lower leg: No edema.     Left lower leg: No edema.  Lymphadenopathy:     Cervical: No cervical adenopathy.  Skin:    General: Skin is warm and dry.     Findings: No lesion.  Neurological:     General: No focal deficit present.     Mental Status: She is alert. Mental status is at baseline.  Psychiatric:        Mood and Affect: Mood normal.        Behavior: Behavior normal.     Lab Results   Component Value Date   WBC 5.3 01/15/2024   HGB 14.7 01/15/2024   HCT 44.2 01/15/2024   PLT 344 01/15/2024   GLUCOSE 101 (H) 01/15/2024   CHOL 328 (H) 01/15/2024   TRIG 81 01/15/2024   HDL 86 01/15/2024   LDLCALC 230 (H) 01/15/2024   ALT 28 08/05/2023   AST 22 08/05/2023   NA 140 01/15/2024   K 3.8 01/15/2024   CL 98 01/15/2024   CREATININE 1.17 (H) 01/15/2024   BUN 11 01/15/2024   CO2 26 01/15/2024   TSH 1.160 01/15/2024   HGBA1C 5.7 (H) 12/31/2017    MM DIAG BREAST TOMO UNI LEFT Result Date: 03/13/2022 CLINICAL DATA:  Callback from screening mammogram for possible asymmetry left breast EXAM: DIGITAL DIAGNOSTIC UNILATERAL LEFT MAMMOGRAM WITH TOMOSYNTHESIS AND CAD; ULTRASOUND LEFT BREAST LIMITED TECHNIQUE: Left digital diagnostic mammography and breast tomosynthesis was performed. The images were evaluated with computer-aided detection.; Targeted ultrasound examination of the left breast was performed. COMPARISON:  Prior films ACR Breast Density Category c: The breast tissue is heterogeneously dense, which may obscure small masses. FINDINGS: Lateral view of left breast, spot compression left MLO view are submitted. Previously questioned asymmetry is persistent on additional views. Targeted ultrasound is performed, showing a normal intramammary lymph node at the left breast 2 o'clock 8 cm from nipple correlating to the mammographic asymmetry. IMPRESSION: Benign findings. RECOMMENDATION: Routine screening mammogram back on schedule. I have discussed the findings and recommendations with the patient. If applicable, a reminder letter will be sent to the patient regarding the next appointment. BI-RADS CATEGORY  2: Benign. Electronically Signed   By: Sherian Rein M.D.   On: 03/13/2022 14:40   US BREAST LTD UNI LEFT INC AXILLA Result Date: 03/13/2022 CLINICAL DATA:  Callback from screening mammogram for possible asymmetry left breast EXAM: DIGITAL DIAGNOSTIC UNILATERAL LEFT MAMMOGRAM WITH  TOMOSYNTHESIS AND CAD; ULTRASOUND LEFT BREAST LIMITED TECHNIQUE: Left digital diagnostic mammography and breast tomosynthesis was performed. The images were evaluated with computer-aided detection.; Targeted ultrasound examination of the left breast was performed. COMPARISON:  Prior films ACR Breast Density Category c: The breast tissue is heterogeneously dense, which may obscure small masses. FINDINGS: Lateral view of left breast, spot compression left MLO view are submitted. Previously questioned asymmetry is persistent on additional views. Targeted ultrasound is performed, showing a normal intramammary lymph node at the left breast 2 o'clock 8 cm from nipple correlating to the mammographic asymmetry. IMPRESSION: Benign findings. RECOMMENDATION: Routine screening mammogram back on schedule.  I have discussed the findings and recommendations with the patient. If applicable, a reminder letter will be sent to the patient regarding the next appointment. BI-RADS CATEGORY  2: Benign. Electronically Signed   By: Sherian Rein M.D.   On: 03/13/2022 14:40   Assessment & Plan:  CKD (chronic kidney disease) stage 2, GFR 60-89 ml/min -     Selenium serum; Future -     Basic metabolic panel; Future -     CBC with Differential/Platelet; Future -     Urinalysis, Routine w reflex microscopic; Future -     Cystatin C with Glomerular Filtration Rate, Estimated (eGFR); Future -     VITAMIN D 25 Hydroxy (Vit-D Deficiency, Fractures); Future  Vitamin D deficiency -     Selenium serum; Future -     VITAMIN D 25 Hydroxy (Vit-D Deficiency, Fractures); Future  Hyperlipidemia with target LDL less than 160 -     Lipid panel; Future -     TSH; Future -     Rosuvastatin Calcium; Take 1 tablet (20 mg total) by mouth daily.  Dispense: 90 tablet; Refill: 0 -     Ezetimibe; Take 1 tablet (10 mg total) by mouth daily.  Dispense: 90 tablet; Refill: 0  Essential hypertension -     EKG 12-Lead  Onychomycosis of toenail -      Ambulatory referral to Podiatry  Prolonged Q-T interval on ECG -     Ambulatory referral to Cardiology  Stage 3a chronic kidney disease (HCC) -     Dapagliflozin Propanediol; Take 1 tablet (10 mg total) by mouth daily before breakfast.  Dispense: 90 tablet; Refill: 0  Other orders -     Microscopic Examination -     Cystatin C with eGFR     Follow-up: Return in about 6 months (around 07/14/2024).  Sanda Linger, MD

## 2024-01-15 NOTE — Patient Instructions (Signed)
 Long QT Syndrome  Long QT syndrome (LQTS) is a heart condition in which the heart takes longer than normal to recharge after each heartbeat. This is caused by an abnormal electrical system in the heart. LQTS can upset the timing of your heartbeats. It can cause dangerous changes in your heart rate and rhythm (arrhythmia) that can lead to cardiac arrest. You can be born with LQTS, or you can develop it later in life. What are the causes? The cause of this condition depends on the type of LQTS that you have. Inherited LQTS. You are born with this condition. It is caused by an abnormal gene that is passed down through your family. Acquired LQTS. You get this condition later in life. It may be caused by: Certain medicines that can affect your heartbeat, like antibiotics and antidepressants. An electrolyte imbalance. Long periods of vomiting or diarrhea. A thyroid disorder. An eating disorder. What increases the risk? This condition is more likely to develop in: Women. People who have an eating disorder, such as anorexia nervosa or bulimia. People who have a family member with LQTS. People who have a family history of unexplained fainting, drowning, or sudden death. What are the signs or symptoms? Symptoms of this condition include: Fainting. A fluttering feeling in your chest. Seizures. Symptoms of inherited LQTS almost always start before age 47.  Some people with this condition have no symptoms. How is this diagnosed? This condition may be diagnosed based on: Your symptoms. Your medical history and family history. A physical exam. Some tests, including: An electrocardiogram (ECG) to measure electrical activity in your heart. Holter monitoring to record your heartbeat for 1-2 days. A stress test to record your heartbeat while you exercise. A blood test to look for genes that cause LQTS. How is this treated? There is no cure for this condition. Treatment depends on the cause, your  symptoms, and whether you have a family history of sudden death. Treatment may include: Making lifestyle changes, such as avoiding competitive sports. Taking supplements to correct abnormal sodium, potassium, calcium, and magnesium levels. Avoiding swimming and diving. Taking heart medicines, such as beta blockers. Having a device implanted that corrects a dangerous heartbeat called a cardioverter-defibrillator (ICD). This senses a fast heartbeat and shocks the heart to restore normal heart rate. Having heart surgery to prevent arrhythmias. Follow these instructions at home: Medicines Take over-the-counter and prescription medicines only as told by your health care provider. Before taking any new medicine, get approval from your health care provider first. Avoid any medicines that can cause this condition. Lifestyle Make lifestyle changes recommended by your health care provider. You may need to avoid: Stressful situations. Situations where sudden loud noises are likely. If you drink alcohol: Limit how much you have to: 0-1 drink a day for women who are not pregnant. 0-2 drinks a day for men. Know how much alcohol is in a drink. In the [place] one drink equals one 12 oz bottle of beer (355 mL), one 5 oz glass of wine (148 mL), or one 1 oz glass of hard liquor (44 mL). Do not use any products that contain nicotine or tobacco. These products include cigarettes, chewing tobacco, and vaping devices, such as e-cigarettes. If you need help quitting, ask your health care provider. General instructions Develop a plan with your health care provider for how to deal with a sudden arrhythmia. Tell people who live with you about the signs of a sudden arrhythmia. Wear a medical ID necklace or bracelet that  states your diagnosis and contact information. Have an automated external defibrillator (AED) available at home or work. Get treatment and support if you feel stress, fear, anxiety, or depression. Keep  all follow-up visits. This is important. Contact a health care provider if: You are suffering from stress, fear, anxiety, or depression. You have long periods of vomiting or diarrhea. Get help right away if: You have chest pain or difficulty breathing. You have a fluttering feeling in your chest. You faint. You have a seizure. These symptoms may be an emergency. Get help right away. Call 911. Do not wait to see if the symptoms will go away. Do not drive yourself to the hospital. Summary Long QT syndrome (LQTS) is a heart condition in which your heart takes longer than normal to recharge after each heartbeat. This is caused by an abnormal electrical system in your heart. LQTS can upset the timing of your heartbeats and cause dangerous changes in your heart rate and rhythm. Some people are born with LQTS. Others develop it later in life. Some people with this condition have no symptoms. Those who do have symptoms may experience fainting, a fluttering feeling in the chest, or seizures. There is no cure for this condition. Treatment depends on the cause, your symptoms, and whether you have a family history of sudden death. This information is not intended to replace advice given to you by your health care provider. Make sure you discuss any questions you have with your health care provider. Document Revised: 10/05/2021 Document Reviewed: 10/05/2021 Elsevier Patient Education  2024 [employer].

## 2024-01-16 ENCOUNTER — Encounter: Payer: Self-pay | Admitting: Internal Medicine

## 2024-01-16 LAB — CBC WITH DIFFERENTIAL/PLATELET
Basophils Absolute: 0.1 10*3/uL (ref 0.0–0.2)
Basos: 1 %
EOS (ABSOLUTE): 0.1 10*3/uL (ref 0.0–0.4)
Eos: 2 %
Hematocrit: 44.2 % (ref 34.0–46.6)
Hemoglobin: 14.7 g/dL (ref 11.1–15.9)
Immature Grans (Abs): 0 10*3/uL (ref 0.0–0.1)
Immature Granulocytes: 0 %
Lymphocytes Absolute: 1.9 10*3/uL (ref 0.7–3.1)
Lymphs: 36 %
MCH: 27.9 pg (ref 26.6–33.0)
MCHC: 33.3 g/dL (ref 31.5–35.7)
MCV: 84 fL (ref 79–97)
Monocytes Absolute: 0.4 10*3/uL (ref 0.1–0.9)
Monocytes: 8 %
Neutrophils Absolute: 2.8 10*3/uL (ref 1.4–7.0)
Neutrophils: 53 %
Platelets: 344 10*3/uL (ref 150–450)
RBC: 5.26 x10E6/uL (ref 3.77–5.28)
RDW: 13.7 % (ref 11.7–15.4)
WBC: 5.3 10*3/uL (ref 3.4–10.8)

## 2024-01-16 LAB — URINALYSIS, ROUTINE W REFLEX MICROSCOPIC
Bilirubin, UA: NEGATIVE
Glucose, UA: NEGATIVE
Ketones, UA: NEGATIVE
Nitrite, UA: NEGATIVE
Protein,UA: NEGATIVE
RBC, UA: NEGATIVE
Specific Gravity, UA: 1.006 (ref 1.005–1.030)
Urobilinogen, Ur: 0.2 mg/dL (ref 0.2–1.0)
pH, UA: 7 (ref 5.0–7.5)

## 2024-01-16 LAB — LIPID PANEL
Chol/HDL Ratio: 3.8 ratio (ref 0.0–4.4)
Cholesterol, Total: 328 mg/dL — ABNORMAL HIGH (ref 100–199)
HDL: 86 mg/dL (ref >39)
LDL Chol Calc (NIH): 230 mg/dL — ABNORMAL HIGH (ref 0–99)
Triglycerides: 81 mg/dL (ref 0–149)
VLDL Cholesterol Cal: 12 mg/dL (ref 5–40)

## 2024-01-16 LAB — BASIC METABOLIC PANEL
BUN/Creatinine Ratio: 9 — ABNORMAL LOW (ref 12–28)
BUN: 11 mg/dL (ref 8–27)
CO2: 26 mmol/L (ref 20–29)
Calcium: 10.7 mg/dL — ABNORMAL HIGH (ref 8.7–10.3)
Chloride: 98 mmol/L (ref 96–106)
Creatinine, Ser: 1.17 mg/dL — ABNORMAL HIGH (ref 0.57–1.00)
Glucose: 101 mg/dL — ABNORMAL HIGH (ref 70–99)
Potassium: 3.8 mmol/L (ref 3.5–5.2)
Sodium: 140 mmol/L (ref 134–144)
eGFR: 53 mL/min/{1.73_m2} — ABNORMAL LOW (ref >59)

## 2024-01-16 LAB — CYSTATIN C WITH EGFR
CYSTATIN C: 0.84 mg/L (ref 0.72–1.16)
eGFR: 91 mL/min/{1.73_m2} (ref >59)

## 2024-01-16 LAB — MICROSCOPIC EXAMINATION
Bacteria, UA: NONE SEEN
Casts: NONE SEEN /LPF
WBC, UA: NONE SEEN /HPF (ref 0–5)

## 2024-01-16 LAB — VITAMIN D 25 HYDROXY (VIT D DEFICIENCY, FRACTURES): Vit D, 25-Hydroxy: 63.9 ng/mL (ref 30.0–100.0)

## 2024-01-16 LAB — TSH: TSH: 1.16 u[IU]/mL (ref 0.450–4.500)

## 2024-01-16 MED ORDER — EZETIMIBE 10 MG PO TABS: 10.0000 mg | ORAL_TABLET | Freq: Every day | ORAL | 0 refills | Status: DC

## 2024-01-16 MED ORDER — ROSUVASTATIN CALCIUM 20 MG PO TABS
20.0000 mg | ORAL_TABLET | Freq: Every day | ORAL | 0 refills | Status: DC
Start: 2024-01-16 — End: 2024-04-01

## 2024-01-16 MED ORDER — DAPAGLIFLOZIN PROPANEDIOL 10 MG PO TABS: 10.0000 mg | ORAL_TABLET | Freq: Every day | ORAL | 0 refills | Status: DC

## 2024-01-21 DIAGNOSIS — E785 Hyperlipidemia, unspecified: Secondary | ICD-10-CM | POA: Diagnosis not present

## 2024-01-21 DIAGNOSIS — R0789 Other chest pain: Secondary | ICD-10-CM | POA: Diagnosis not present

## 2024-01-21 DIAGNOSIS — I10 Essential (primary) hypertension: Secondary | ICD-10-CM | POA: Diagnosis not present

## 2024-01-21 DIAGNOSIS — R9431 Abnormal electrocardiogram [ECG] [EKG]: Secondary | ICD-10-CM | POA: Diagnosis not present

## 2024-01-23 ENCOUNTER — Encounter: Payer: BC Managed Care – PPO | Admitting: Internal Medicine

## 2024-01-23 DIAGNOSIS — E785 Hyperlipidemia, unspecified: Secondary | ICD-10-CM | POA: Diagnosis not present

## 2024-01-24 ENCOUNTER — Other Ambulatory Visit: Payer: Self-pay | Admitting: Internal Medicine

## 2024-01-24 DIAGNOSIS — N1831 Chronic kidney disease, stage 3a: Secondary | ICD-10-CM

## 2024-02-05 DIAGNOSIS — R0789 Other chest pain: Secondary | ICD-10-CM | POA: Diagnosis not present

## 2024-02-25 IMAGING — US US BREAST*L* LIMITED INC AXILLA
1 series · 7 of 7 positions shown · non-contrast
Comparison: Prior films

CLINICAL DATA: Callback from screening mammogram for possible
asymmetry left breast

EXAM:
DIGITAL DIAGNOSTIC UNILATERAL LEFT MAMMOGRAM WITH TOMOSYNTHESIS AND
CAD; ULTRASOUND LEFT BREAST LIMITED
TECHNIQUE: Left digital diagnostic mammography and breast tomosynthesis was
performed. The images were evaluated with computer-aided detection.;
Targeted ultrasound examination of the left breast was performed.

[Series 1: us breast*left* limited inc axilla · 0.05mm/px · 7 of 7 slices shown]
[im 1/7]
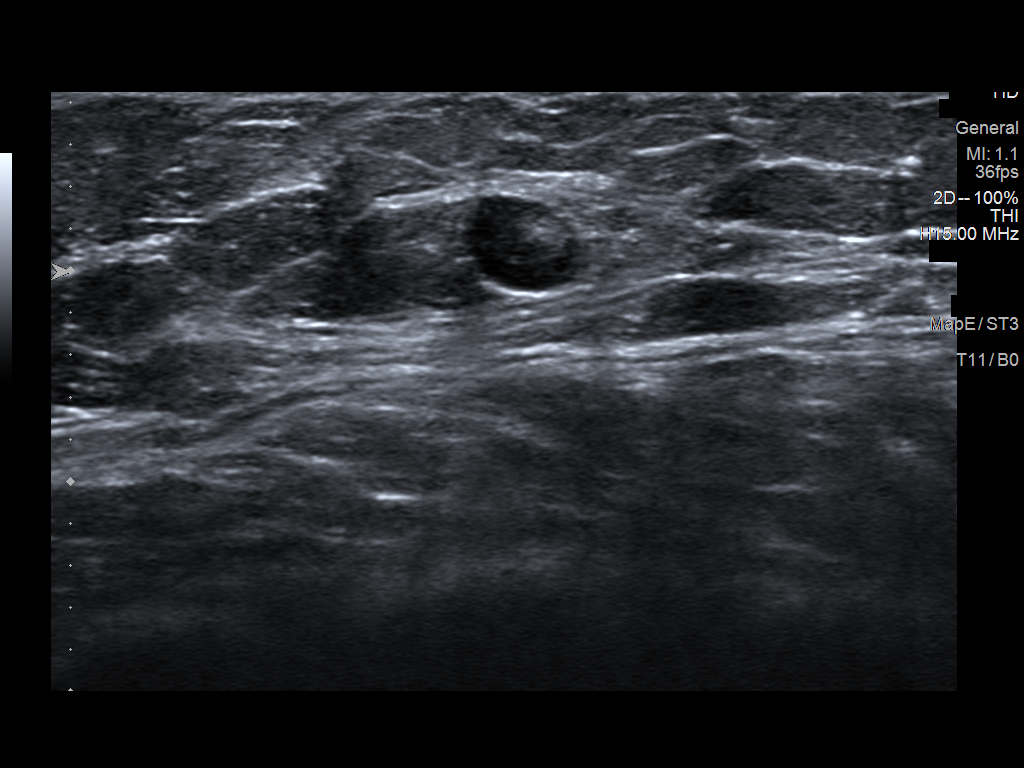
[im 2/7]
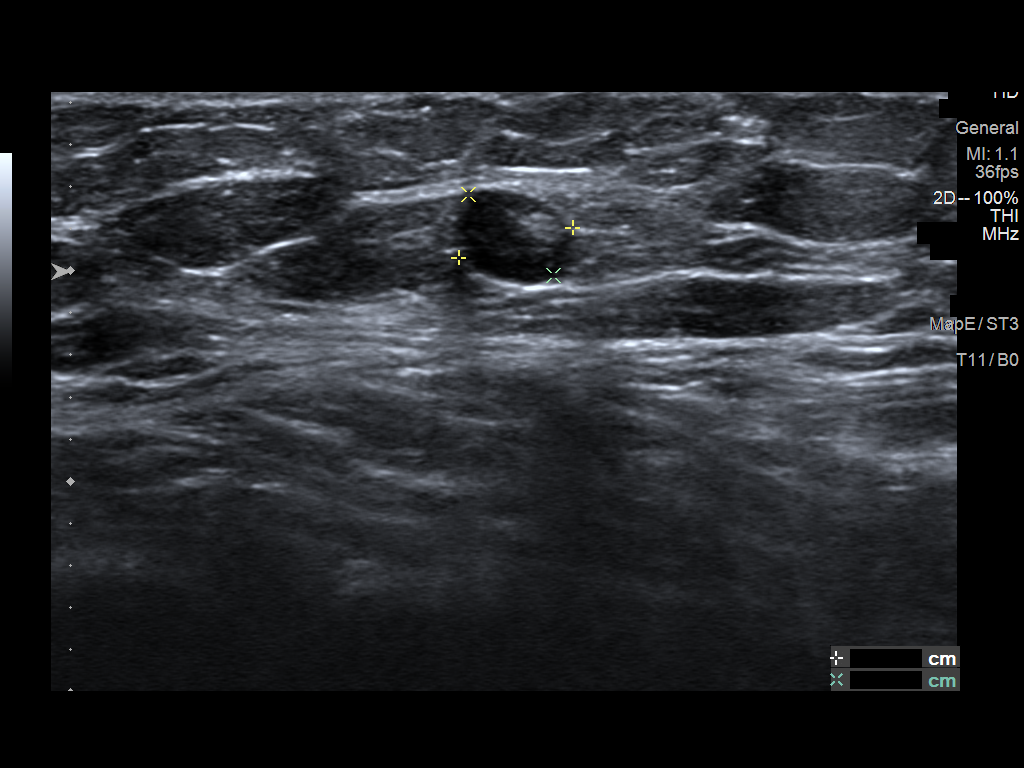
[im 3/7]
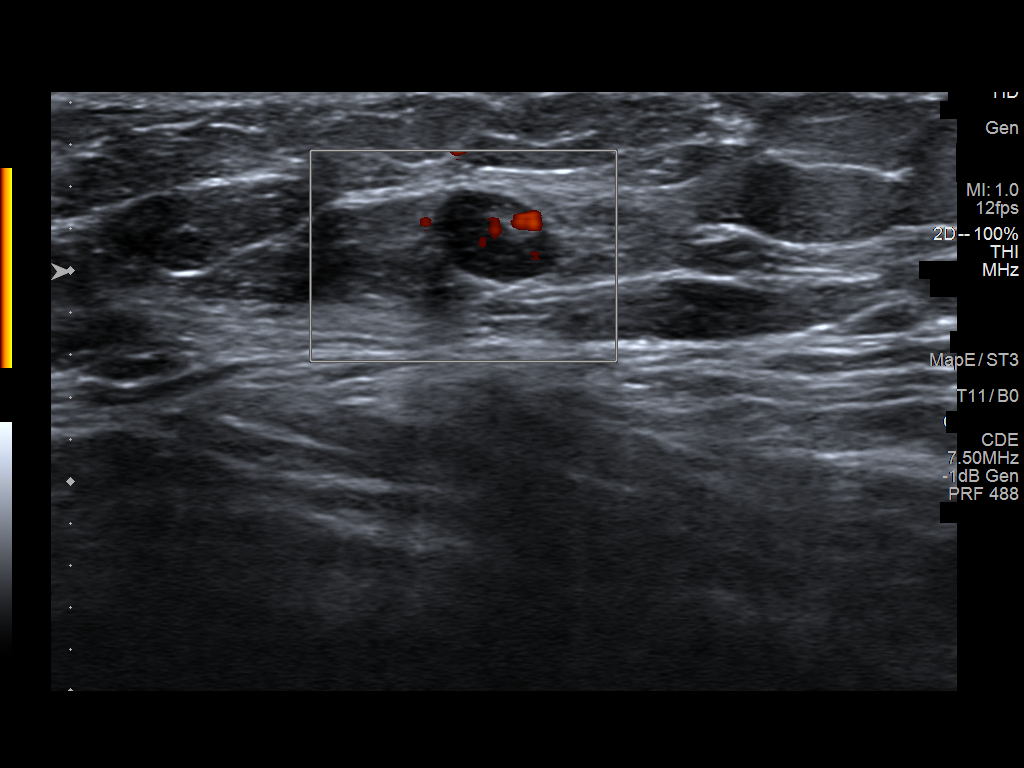
[im 4/7]
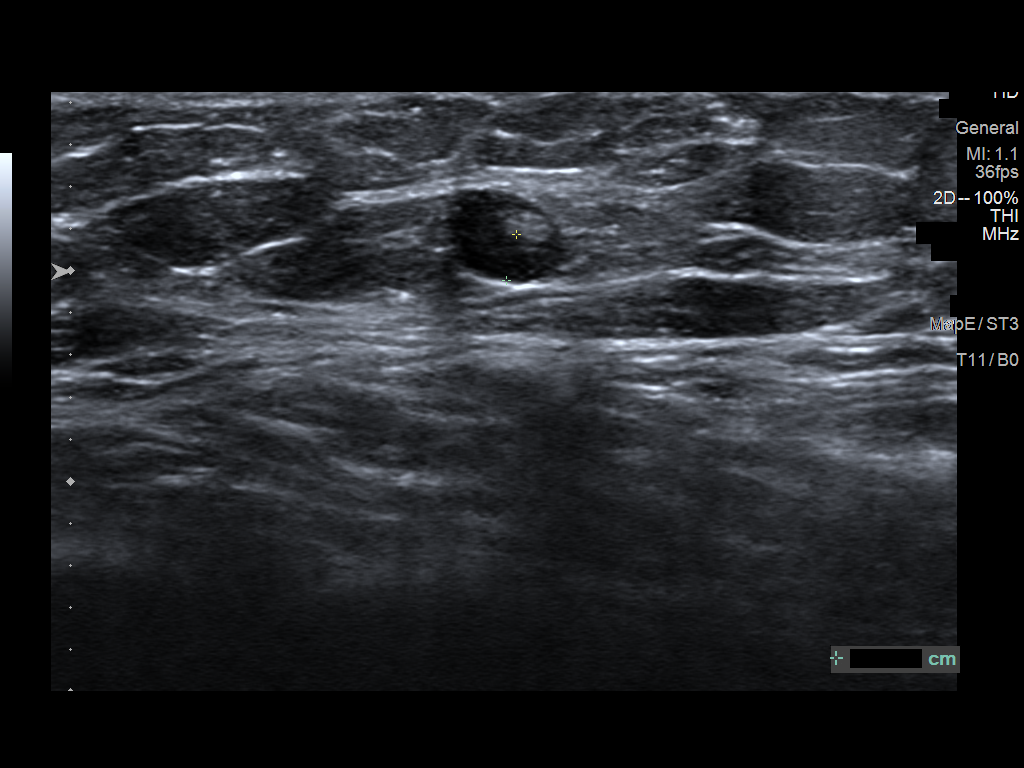
[im 5/7]
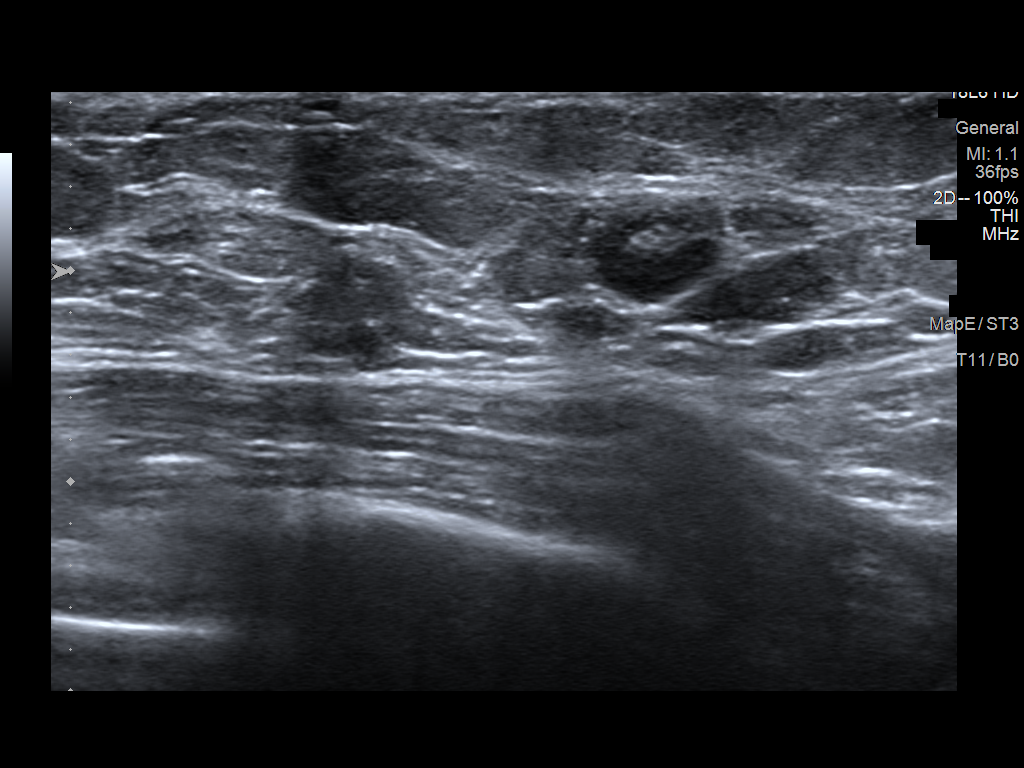
[im 6/7]
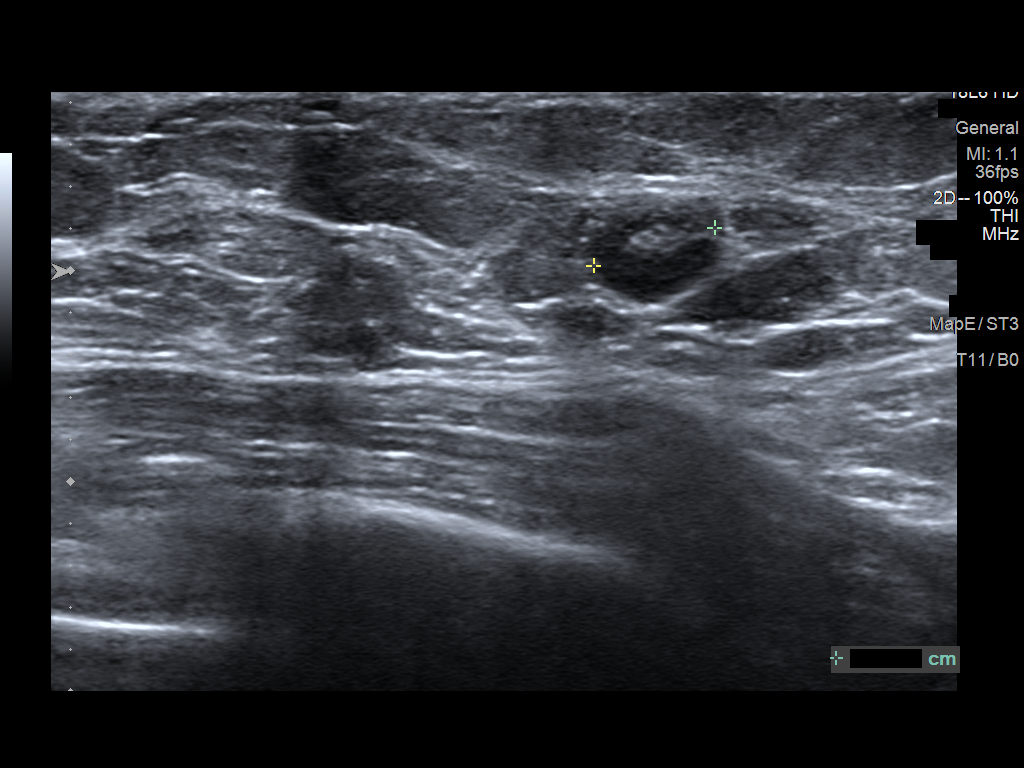
[im 7/7]
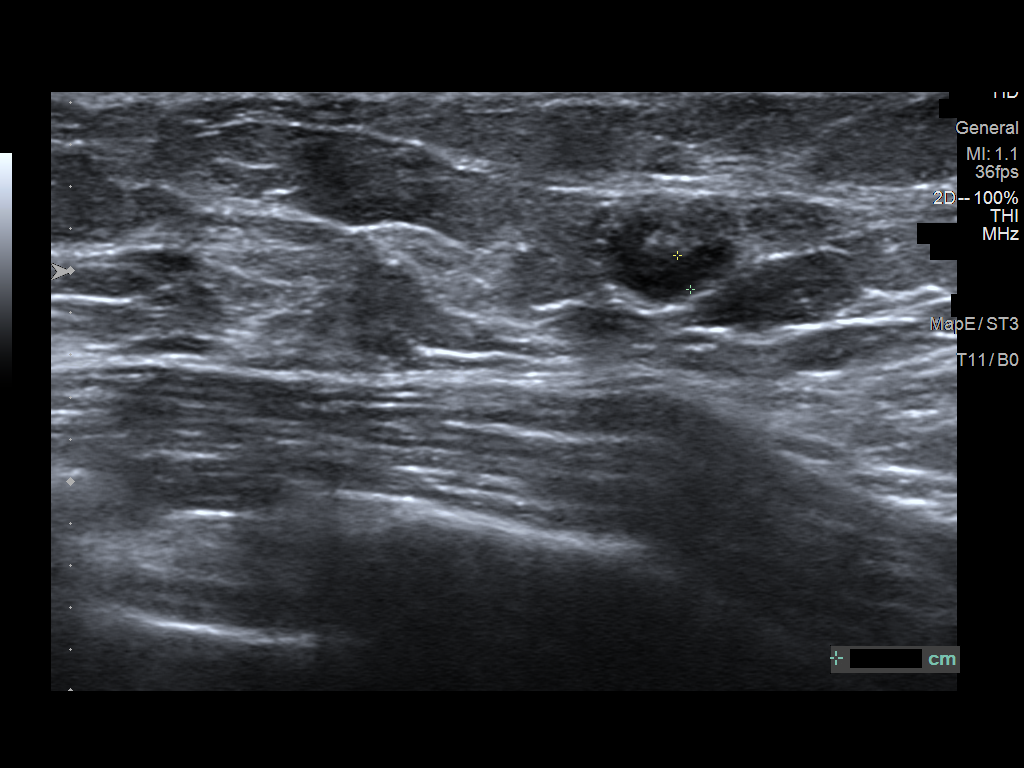

[7 of 7 positions shown; findings below may reference images not displayed]

ACR Breast Density Category c: The breast tissue is heterogeneously
dense, which may obscure small masses.
FINDINGS: Lateral view of left breast, spot compression left MLO view are
submitted. Previously questioned asymmetry is persistent on
additional views.

Targeted ultrasound is performed, showing a normal intramammary
lymph node at the left breast 2 o'clock 8 cm from nipple correlating
to the mammographic asymmetry.
IMPRESSION: Benign findings.

RECOMMENDATION:
Routine screening mammogram back on schedule.

I have discussed the findings and recommendations with the patient.
If applicable, a reminder letter will be sent to the patient
regarding the next appointment.

BI-RADS CATEGORY  2: Benign.

## 2024-02-25 IMAGING — MG MM DIGITAL DIAGNOSTIC UNILAT*L* W/ TOMO W/ CAD
8 series · 9 of 24 positions shown · non-contrast
Comparison: Prior films

CLINICAL DATA: Callback from screening mammogram for possible
asymmetry left breast

EXAM:
DIGITAL DIAGNOSTIC UNILATERAL LEFT MAMMOGRAM WITH TOMOSYNTHESIS AND
CAD; ULTRASOUND LEFT BREAST LIMITED
TECHNIQUE: Left digital diagnostic mammography and breast tomosynthesis was
performed. The images were evaluated with computer-aided detection.;
Targeted ultrasound examination of the left breast was performed.

[L MLO synth-2D (1 of 3)]
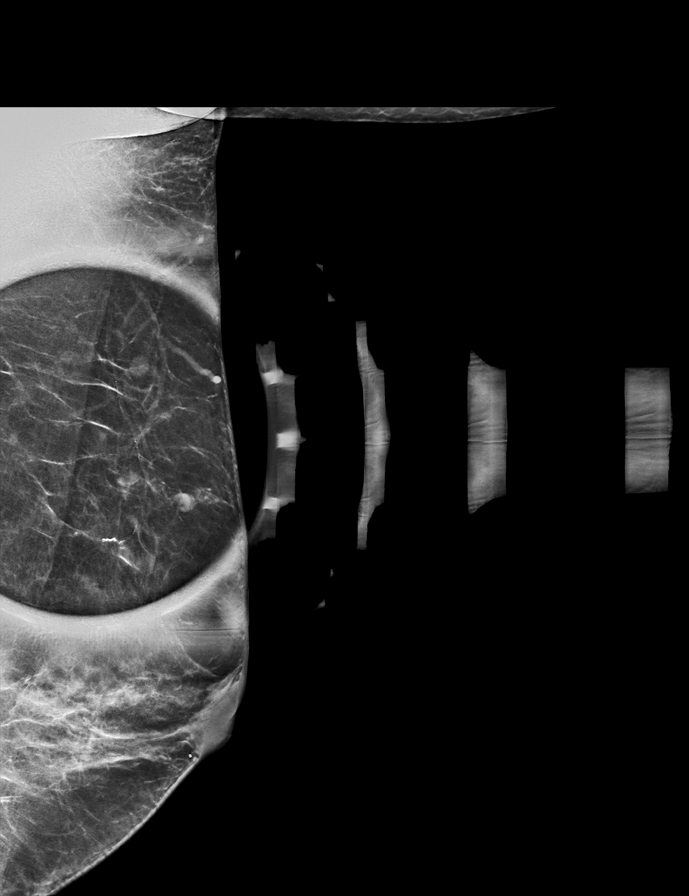

[L MLO synth-2D (2 of 3)]
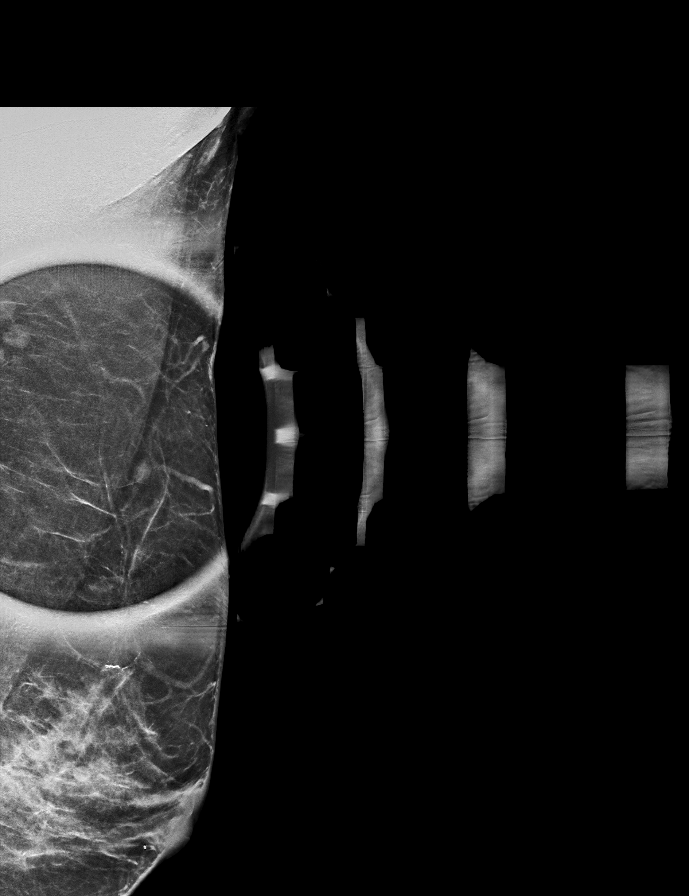

[L MLO synth-2D (3 of 3)]
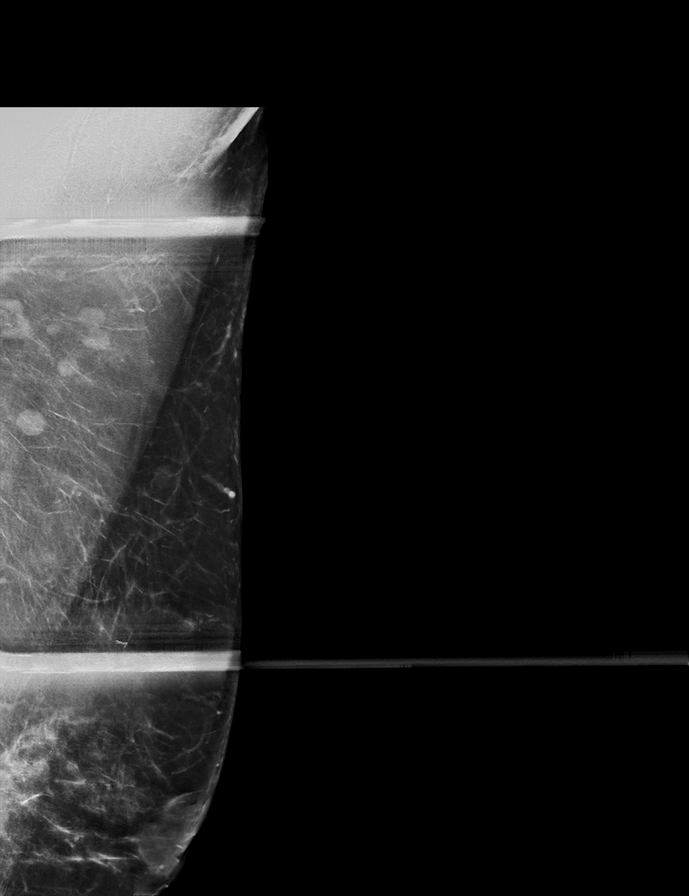

[L ML synth-2D]
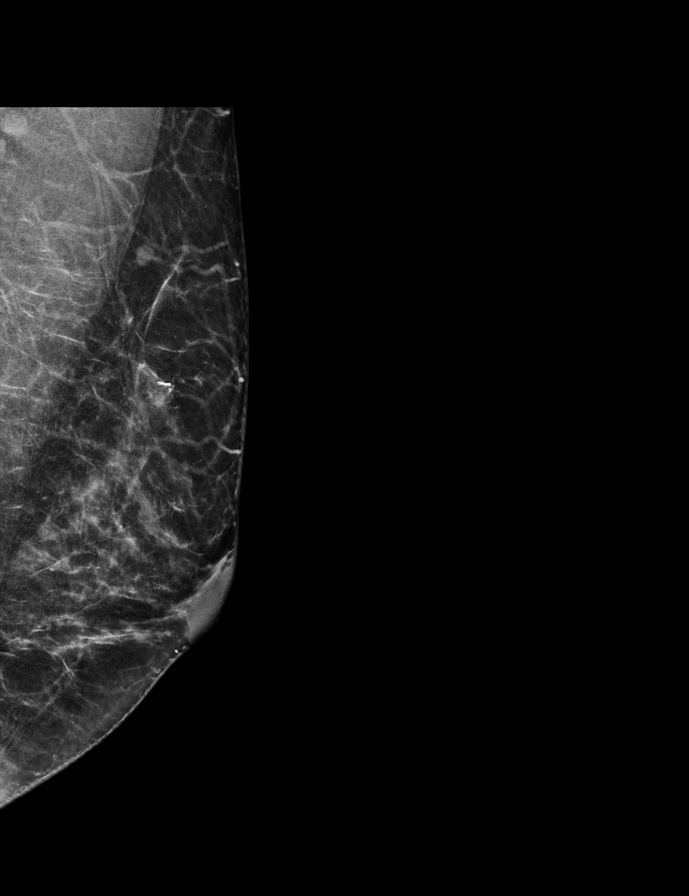

[L MLO tomo · 2 of 82 frames shown (1 of 3)]
[frame 27/82]
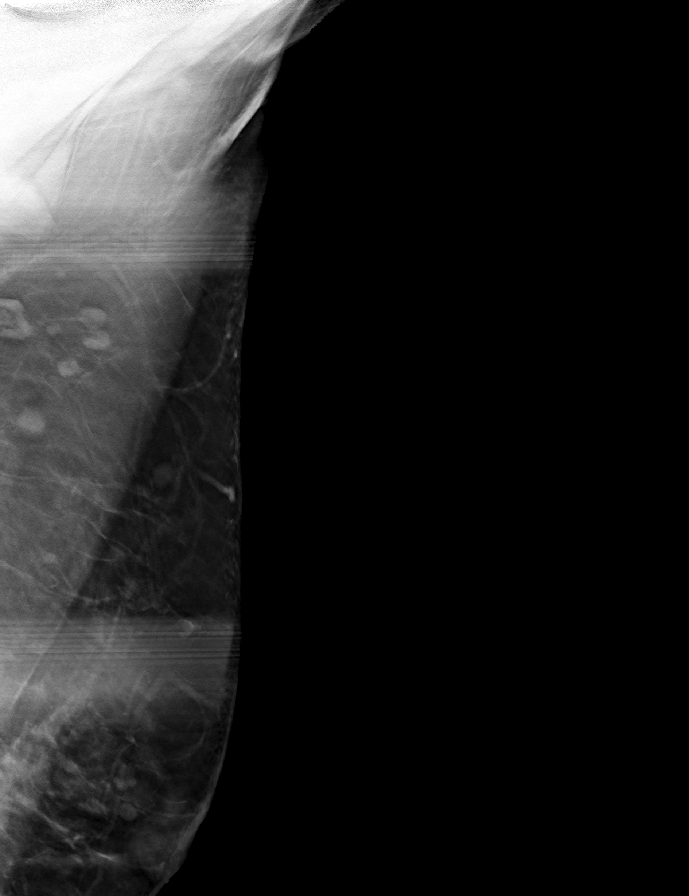
[frame 41/82]
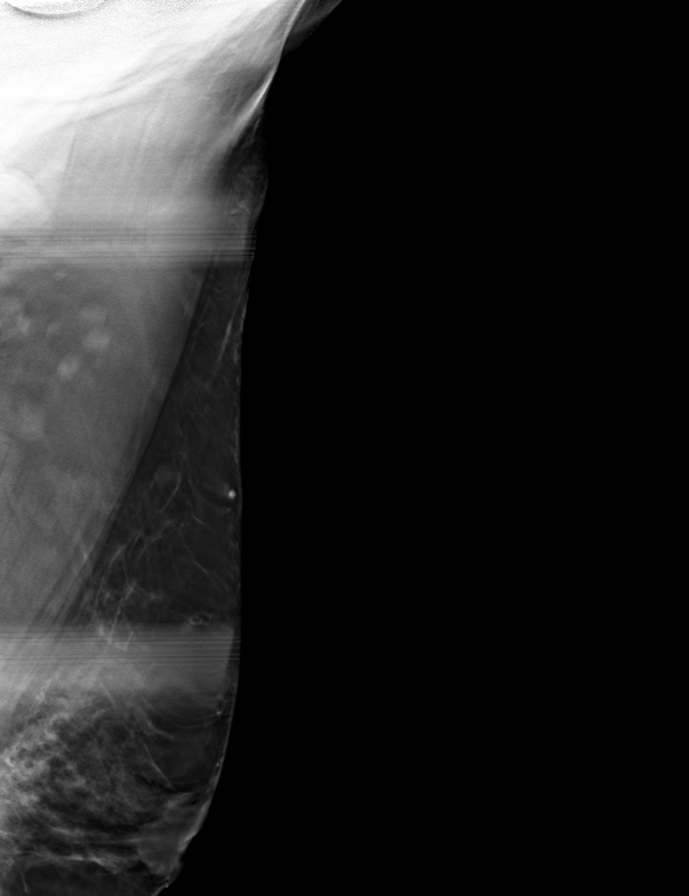

[L MLO tomo (2 of 3) · tomo slice 27/54.0]
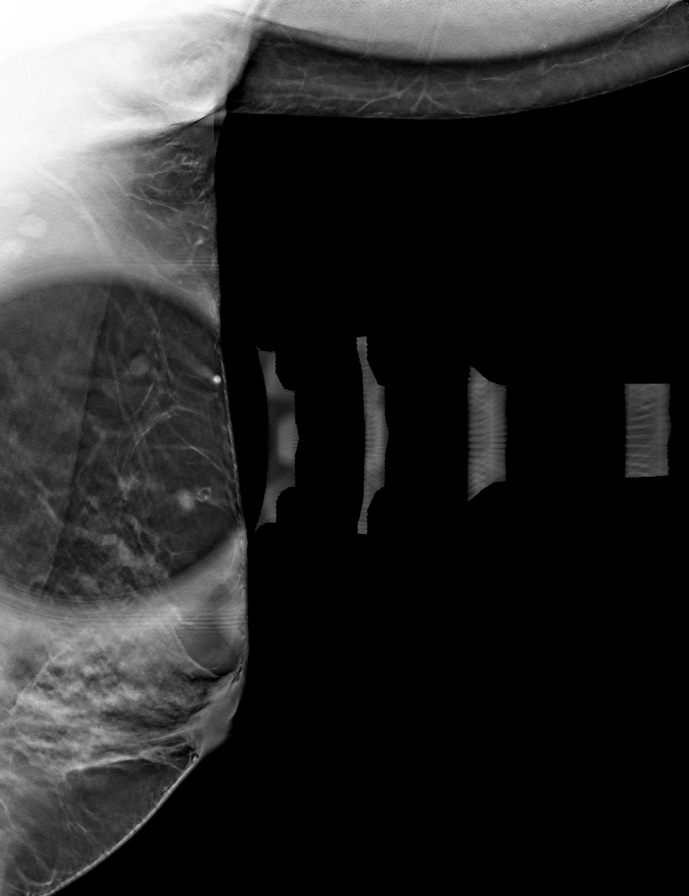

[L ML tomo · tomo slice 30/59.0]
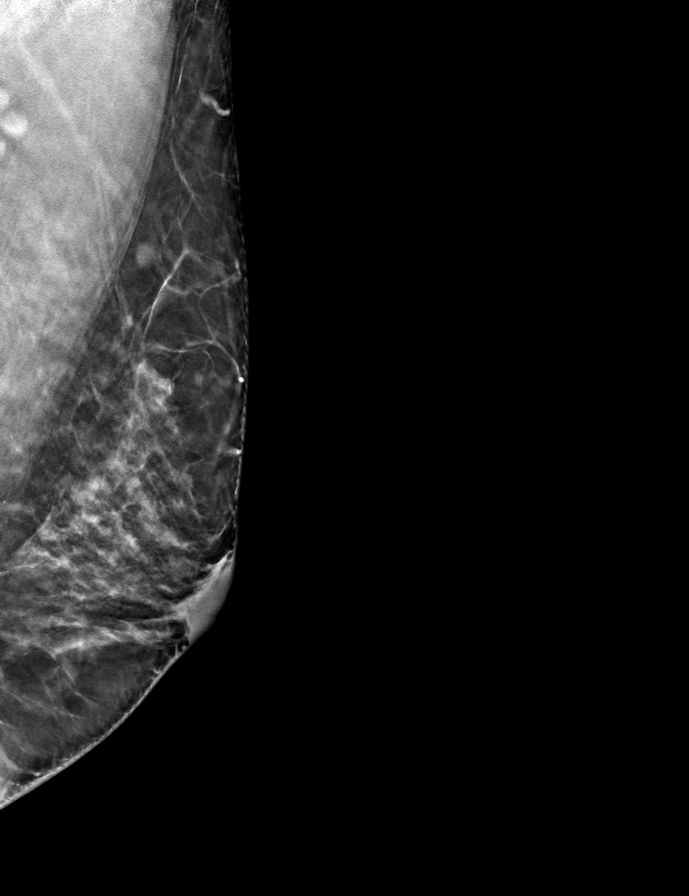

[L MLO tomo (3 of 3) · tomo slice 28/55.0]
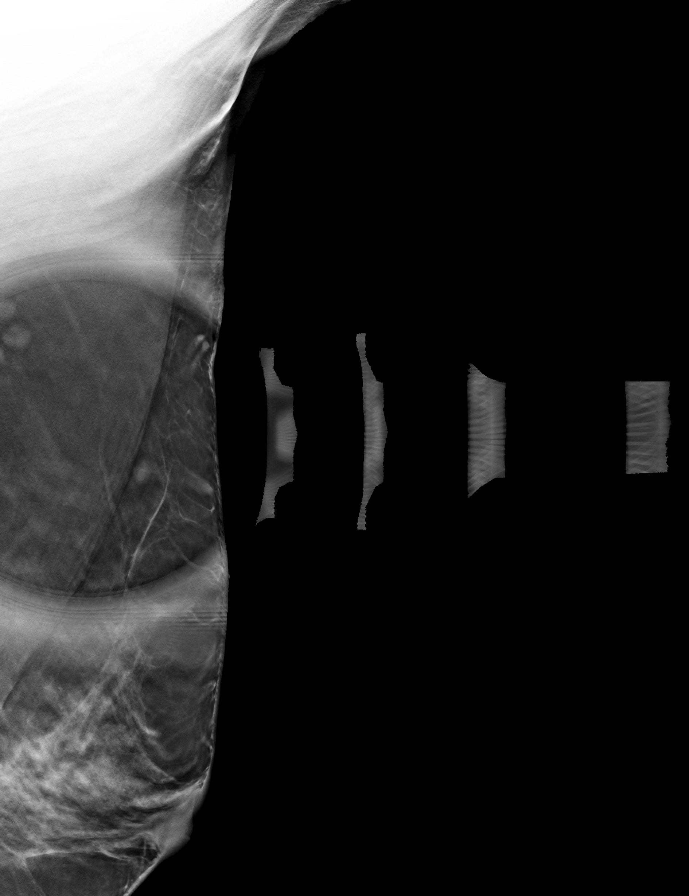

[9 of 24 positions shown; findings below may reference images not displayed]

ACR Breast Density Category c: The breast tissue is heterogeneously
dense, which may obscure small masses.
FINDINGS: Lateral view of left breast, spot compression left MLO view are
submitted. Previously questioned asymmetry is persistent on
additional views.

Targeted ultrasound is performed, showing a normal intramammary
lymph node at the left breast 2 o'clock 8 cm from nipple correlating
to the mammographic asymmetry.
IMPRESSION: Benign findings.

RECOMMENDATION:
Routine screening mammogram back on schedule.

I have discussed the findings and recommendations with the patient.
If applicable, a reminder letter will be sent to the patient
regarding the next appointment.

BI-RADS CATEGORY  2: Benign.

## 2024-04-01 ENCOUNTER — Encounter: Payer: Self-pay | Admitting: Internal Medicine

## 2024-04-01 ENCOUNTER — Other Ambulatory Visit: Payer: Self-pay

## 2024-04-01 DIAGNOSIS — N1831 Chronic kidney disease, stage 3a: Secondary | ICD-10-CM

## 2024-04-01 DIAGNOSIS — E785 Hyperlipidemia, unspecified: Secondary | ICD-10-CM

## 2024-04-01 MED ORDER — ROSUVASTATIN CALCIUM 20 MG PO TABS: 20.0000 mg | ORAL_TABLET | Freq: Every day | ORAL | 1 refills | Status: DC

## 2024-04-01 MED ORDER — DAPAGLIFLOZIN PROPANEDIOL 10 MG PO TABS: 10.0000 mg | ORAL_TABLET | Freq: Every day | ORAL | 1 refills | Status: DC

## 2024-04-01 MED ORDER — EZETIMIBE 10 MG PO TABS: 10.0000 mg | ORAL_TABLET | Freq: Every day | ORAL | 1 refills | Status: DC

## 2024-05-06 ENCOUNTER — Ambulatory Visit: Payer: BC Managed Care – PPO | Admitting: Internal Medicine

## 2024-07-01 ENCOUNTER — Encounter: Payer: Self-pay | Admitting: Internal Medicine

## 2024-07-01 ENCOUNTER — Ambulatory Visit: Admitting: Internal Medicine

## 2024-07-01 VITALS — BP 132/86 | HR 79 | Temp 97.9°F | Resp 16 | Ht 65.0 in | Wt 155.0 lb

## 2024-07-01 DIAGNOSIS — E559 Vitamin D deficiency, unspecified: Secondary | ICD-10-CM

## 2024-07-01 DIAGNOSIS — Z0000 Encounter for general adult medical examination without abnormal findings: Secondary | ICD-10-CM

## 2024-07-01 DIAGNOSIS — N1831 Chronic kidney disease, stage 3a: Secondary | ICD-10-CM

## 2024-07-01 DIAGNOSIS — I10 Essential (primary) hypertension: Secondary | ICD-10-CM | POA: Diagnosis not present

## 2024-07-01 DIAGNOSIS — E785 Hyperlipidemia, unspecified: Secondary | ICD-10-CM

## 2024-07-01 DIAGNOSIS — Z13820 Encounter for screening for osteoporosis: Secondary | ICD-10-CM | POA: Diagnosis not present

## 2024-07-01 DIAGNOSIS — Z01419 Encounter for gynecological examination (general) (routine) without abnormal findings: Secondary | ICD-10-CM | POA: Diagnosis not present

## 2024-07-01 DIAGNOSIS — Z6825 Body mass index (BMI) 25.0-25.9, adult: Secondary | ICD-10-CM | POA: Diagnosis not present

## 2024-07-01 DIAGNOSIS — Z0001 Encounter for general adult medical examination with abnormal findings: Secondary | ICD-10-CM

## 2024-07-01 LAB — HM PAP SMEAR

## 2024-07-01 NOTE — Patient Instructions (Signed)

## 2024-07-01 NOTE — Progress Notes (Signed)
 Subjective:  Patient ID: Gabrielle Long, female    DOB: 1961/03/29  Age: 63 y.o. MRN: [id number]  CC: Annual Exam, Hypertension, and Hyperlipidemia   HPI Gabrielle Long presents for a CPX and f/up ---  Discussed the use of AI scribe software for clinical note transcription with the patient, who gave verbal consent to proceed.  History of Present Illness Gabrielle Long is a 63 year old female with hyperlipidemia who presents for a recheck of her lipid panel.  She is on a two-dose statin regimen for hyperlipidemia. Her LDL was previously above 200 and has decreased significantly since starting the medication, though it remains above the low limit. An employee health screening in June confirmed the cholesterol was lower than before. No side effects from her medications, such as myalgia or arthralgia, are reported. She is active, engaging [place], and experiences no chest pain or dyspnea during these activities.  A cardiac stress test was conducted in March under the [facility] system, recommended by her cardiologist due to a long QT interval. The results were generally good, but a potential blockage was identified, and further testing was suggested. Scheduling has been challenging due to her travel to [facility] for family reasons.  Her last mammogram was rescheduled for October. She is up to date with her colonoscopy and believes she is on track with her Pap smears, although she does not recall the exact timing of the last one.  No muscle aches, joint aches, chest pain, or shortness of breath. Bowel movements are regular, and she still experiences ankle issues.    Outpatient Medications Prior to Visit  Medication Sig Dispense Refill   Cholecalciferol (VITAMIN D3) 5000 UNITS CAPS Take by mouth.       dapagliflozin  propanediol (FARXIGA ) 10 MG TABS tablet Take 1 tablet (10 mg total) by mouth daily before breakfast. 90 tablet 1   fluticasone  (FLONASE ) 50 MCG/ACT  nasal spray SHAKE LIQUID AND USE 2 SPRAYS IN EACH NOSTRIL DAILY 48 g 1   rosuvastatin  (CRESTOR ) 20 MG tablet Take 1 tablet (20 mg total) by mouth daily. 90 tablet 1   ezetimibe  (ZETIA ) 10 MG tablet Take 1 tablet (10 mg total) by mouth daily. 90 tablet 1   terbinafine  (LAMISIL ) 250 MG tablet Take 1 tablet (250 mg total) by mouth daily. 90 tablet 0   No facility-administered medications prior to visit.    ROS Review of Systems  Objective:  BP 132/86 (BP Location: Left Arm, Patient Position: Sitting, Cuff Size: Normal)   Pulse 79   Temp 97.9 F (36.6 C) (Oral)   Resp 16   Ht 5' 5 (1.651 m)   Wt 155 lb (70.3 kg)   SpO2 98%   BMI 25.79 kg/m   BP Readings from Last 3 Encounters:  07/01/24 132/86  01/15/24 130/86  08/05/23 124/84    Wt Readings from Last 3 Encounters:  07/01/24 155 lb (70.3 kg)  01/15/24 163 lb 12.8 oz (74.3 kg)  08/05/23 165 lb (74.8 kg)    Physical Exam  Lab Results  Component Value Date   WBC 4.5 07/01/2024   HGB 14.0 07/01/2024   HCT 44.4 07/01/2024   PLT 271 07/01/2024   GLUCOSE 86 07/01/2024   CHOL 123 07/01/2024   TRIG 51 07/01/2024   HDL 72 07/01/2024   LDLCALC 39 07/01/2024   ALT 41 (H) 07/01/2024   AST 27 07/01/2024   NA 140 07/01/2024   K 4.1 07/01/2024   CL  102 07/01/2024   CREATININE 0.83 07/01/2024   BUN 13 07/01/2024   CO2 22 07/01/2024   TSH 1.160 01/15/2024   HGBA1C 5.7 (H) 12/31/2017    MM DIAG BREAST TOMO UNI LEFT Result Date: 03/13/2022 CLINICAL DATA:  Callback from screening mammogram for possible asymmetry left breast EXAM: DIGITAL DIAGNOSTIC UNILATERAL LEFT MAMMOGRAM WITH TOMOSYNTHESIS AND CAD; ULTRASOUND LEFT BREAST LIMITED TECHNIQUE: Left digital diagnostic mammography and breast tomosynthesis was performed. The images were evaluated with computer-aided detection.; Targeted ultrasound examination of the left breast was performed. COMPARISON:  Prior films ACR Breast Density Category c: The breast tissue is heterogeneously  dense, which may obscure small masses. FINDINGS: Lateral view of left breast, spot compression left MLO view are submitted. Previously questioned asymmetry is persistent on additional views. Targeted ultrasound is performed, showing a normal intramammary lymph node at the left breast 2 o'clock 8 cm from nipple correlating to the mammographic asymmetry. IMPRESSION: Benign findings. RECOMMENDATION: Routine screening mammogram back on schedule. I have discussed the findings and recommendations with the patient. If applicable, a reminder letter will be sent to the patient regarding the next appointment. BI-RADS CATEGORY  2: Benign. Electronically Signed   By: Craig Farr M.D.   On: 03/13/2022 14:40   US  BREAST LTD UNI LEFT INC AXILLA Result Date: 03/13/2022 CLINICAL DATA:  Callback from screening mammogram for possible asymmetry left breast EXAM: DIGITAL DIAGNOSTIC UNILATERAL LEFT MAMMOGRAM WITH TOMOSYNTHESIS AND CAD; ULTRASOUND LEFT BREAST LIMITED TECHNIQUE: Left digital diagnostic mammography and breast tomosynthesis was performed. The images were evaluated with computer-aided detection.; Targeted ultrasound examination of the left breast was performed. COMPARISON:  Prior films ACR Breast Density Category c: The breast tissue is heterogeneously dense, which may obscure small masses. FINDINGS: Lateral view of left breast, spot compression left MLO view are submitted. Previously questioned asymmetry is persistent on additional views. Targeted ultrasound is performed, showing a normal intramammary lymph node at the left breast 2 o'clock 8 cm from nipple correlating to the mammographic asymmetry. IMPRESSION: Benign findings. RECOMMENDATION: Routine screening mammogram back on schedule. I have discussed the findings and recommendations with the patient. If applicable, a reminder letter will be sent to the patient regarding the next appointment. BI-RADS CATEGORY  2: Benign. Electronically Signed   By: Craig Farr  M.D.   On: 03/13/2022 14:40   Assessment & Plan:  Stage 3a chronic kidney disease (HCC) -     Basic metabolic panel with GFR; Future -     Microalbumin / creatinine urine ratio; Future -     Urinalysis, Routine w reflex microscopic; Future  Essential hypertension -     CBC with Differential/Platelet; Future -     Basic metabolic panel with GFR; Future -     Urinalysis, Routine w reflex microscopic; Future  Hyperlipidemia with target LDL less than 160 -     Lipid panel; Future -     Hepatic function panel; Future -     CK; Future  Vitamin D  deficiency -     VITAMIN D  25 Hydroxy (Vit-D Deficiency, Fractures); Future  Encounter for well adult exam with abnormal findings  Other orders -     Microscopic Examination     Follow-up: Return in about 6 months (around 01/01/2025).  Debby Molt, MD

## 2024-07-02 ENCOUNTER — Ambulatory Visit: Payer: Self-pay | Admitting: Internal Medicine

## 2024-07-02 LAB — URINALYSIS, ROUTINE W REFLEX MICROSCOPIC
Bilirubin, UA: NEGATIVE
Ketones, UA: NEGATIVE
Nitrite, UA: NEGATIVE
Protein,UA: NEGATIVE
RBC, UA: NEGATIVE
Specific Gravity, UA: 1.012 (ref 1.005–1.030)
Urobilinogen, Ur: 0.2 mg/dL (ref 0.2–1.0)
pH, UA: 6.5 (ref 5.0–7.5)

## 2024-07-02 LAB — CBC WITH DIFFERENTIAL/PLATELET
Basophils Absolute: 0.1 x10E3/uL (ref 0.0–0.2)
Basos: 1 %
EOS (ABSOLUTE): 0.1 x10E3/uL (ref 0.0–0.4)
Eos: 2 %
Hematocrit: 44.4 % (ref 34.0–46.6)
Hemoglobin: 14 g/dL (ref 11.1–15.9)
Immature Grans (Abs): 0 x10E3/uL (ref 0.0–0.1)
Immature Granulocytes: 0 %
Lymphocytes Absolute: 1.7 x10E3/uL (ref 0.7–3.1)
Lymphs: 38 %
MCH: 27.5 pg (ref 26.6–33.0)
MCHC: 31.5 g/dL (ref 31.5–35.7)
MCV: 87 fL (ref 79–97)
Monocytes Absolute: 0.4 x10E3/uL (ref 0.1–0.9)
Monocytes: 8 %
Neutrophils Absolute: 2.3 x10E3/uL (ref 1.4–7.0)
Neutrophils: 51 %
Platelets: 271 x10E3/uL (ref 150–450)
RBC: 5.1 x10E6/uL (ref 3.77–5.28)
RDW: 13.9 % (ref 11.7–15.4)
WBC: 4.5 x10E3/uL (ref 3.4–10.8)

## 2024-07-02 LAB — BASIC METABOLIC PANEL WITH GFR
BUN/Creatinine Ratio: 16 (ref 12–28)
BUN: 13 mg/dL (ref 8–27)
CO2: 22 mmol/L (ref 20–29)
Calcium: 10 mg/dL (ref 8.7–10.3)
Chloride: 102 mmol/L (ref 96–106)
Creatinine, Ser: 0.83 mg/dL (ref 0.57–1.00)
Glucose: 86 mg/dL (ref 70–99)
Potassium: 4.1 mmol/L (ref 3.5–5.2)
Sodium: 140 mmol/L (ref 134–144)
eGFR: 80 mL/min/1.73 (ref >59)

## 2024-07-02 LAB — LIPID PANEL
Chol/HDL Ratio: 1.7 ratio (ref 0.0–4.4)
Cholesterol, Total: 123 mg/dL (ref 100–199)
HDL: 72 mg/dL (ref >39)
LDL Chol Calc (NIH): 39 mg/dL (ref 0–99)
Triglycerides: 51 mg/dL (ref 0–149)
VLDL Cholesterol Cal: 12 mg/dL (ref 5–40)

## 2024-07-02 LAB — MICROSCOPIC EXAMINATION
Bacteria, UA: NONE SEEN
Casts: NONE SEEN /LPF
RBC, Urine: NONE SEEN /HPF (ref 0–2)

## 2024-07-02 LAB — MICROALBUMIN / CREATININE URINE RATIO
Creatinine, Urine: 28.8 mg/dL
Microalb/Creat Ratio: <10 mg/g{creat} (ref 0–29)
Microalbumin, Urine: <3 ug/mL

## 2024-07-02 LAB — HEPATIC FUNCTION PANEL
ALT: 41 IU/L — ABNORMAL HIGH (ref 0–32)
AST: 27 IU/L (ref 0–40)
Albumin: 4.8 g/dL (ref 3.9–4.9)
Alkaline Phosphatase: 76 IU/L (ref 44–121)
Bilirubin Total: 0.6 mg/dL (ref 0.0–1.2)
Bilirubin, Direct: 0.21 mg/dL (ref 0.00–0.40)
Total Protein: 7.5 g/dL (ref 6.0–8.5)

## 2024-07-02 LAB — CK: Total CK: 256 U/L — ABNORMAL HIGH (ref 32–182)

## 2024-07-02 LAB — VITAMIN D 25 HYDROXY (VIT D DEFICIENCY, FRACTURES): Vit D, 25-Hydroxy: 42.6 ng/mL (ref 30.0–100.0)

## 2024-07-06 ENCOUNTER — Other Ambulatory Visit: Payer: Self-pay

## 2024-07-06 ENCOUNTER — Other Ambulatory Visit: Payer: Self-pay | Admitting: Internal Medicine

## 2024-07-06 DIAGNOSIS — E785 Hyperlipidemia, unspecified: Secondary | ICD-10-CM

## 2024-07-06 DIAGNOSIS — N1831 Chronic kidney disease, stage 3a: Secondary | ICD-10-CM

## 2024-07-06 MED ORDER — DAPAGLIFLOZIN PROPANEDIOL 10 MG PO TABS: 10.0000 mg | ORAL_TABLET | Freq: Every day | ORAL | 1 refills | Status: AC

## 2024-07-06 MED ORDER — DAPAGLIFLOZIN PROPANEDIOL 10 MG PO TABS
10.0000 mg | ORAL_TABLET | Freq: Every day | ORAL | 1 refills | Status: DC
Start: 2024-07-06 — End: 2024-07-06

## 2024-07-06 MED ORDER — DAPAGLIFLOZIN PROPANEDIOL 10 MG PO TABS: 10.0000 mg | ORAL_TABLET | Freq: Every day | ORAL | 1 refills | Status: DC

## 2024-07-06 MED ORDER — ROSUVASTATIN CALCIUM 20 MG PO TABS: 20.0000 mg | ORAL_TABLET | Freq: Every day | ORAL | 1 refills | Status: DC

## 2024-07-06 MED ORDER — ROSUVASTATIN CALCIUM 20 MG PO TABS: 20.0000 mg | ORAL_TABLET | Freq: Every day | ORAL | 1 refills | Status: AC

## 2024-07-08 DIAGNOSIS — R931 Abnormal findings on diagnostic imaging of heart and coronary circulation: Secondary | ICD-10-CM | POA: Diagnosis not present

## 2024-07-08 DIAGNOSIS — R9439 Abnormal result of other cardiovascular function study: Secondary | ICD-10-CM | POA: Diagnosis not present

## 2024-07-08 DIAGNOSIS — I2510 Atherosclerotic heart disease of native coronary artery without angina pectoris: Secondary | ICD-10-CM | POA: Diagnosis not present

## 2024-07-08 DIAGNOSIS — Z136 Encounter for screening for cardiovascular disorders: Secondary | ICD-10-CM | POA: Diagnosis not present

## 2024-08-25 ENCOUNTER — Other Ambulatory Visit: Payer: Self-pay | Admitting: Medical Genetics

## 2024-10-21 ENCOUNTER — Ambulatory Visit: Payer: Self-pay

## 2024-10-21 DIAGNOSIS — R030 Elevated blood-pressure reading, without diagnosis of hypertension: Secondary | ICD-10-CM | POA: Diagnosis not present

## 2024-10-21 DIAGNOSIS — R350 Frequency of micturition: Secondary | ICD-10-CM | POA: Diagnosis not present

## 2024-10-21 DIAGNOSIS — N3001 Acute cystitis with hematuria: Secondary | ICD-10-CM | POA: Diagnosis not present

## 2024-10-21 NOTE — Telephone Encounter (Signed)
 FYI Only or Action Required?: Action required by provider: referral request and update on patient condition.  Patient was last seen in primary care on 07/01/2024 by Joshua Debby CROME, MD.  Called Nurse Triage reporting Rectal Bleeding.  Symptoms began several weeks ago.  Interventions attempted: OTC medications: prep-H.  Symptoms are: unchanged.  Triage Disposition: See PCP Within 2 Weeks  Patient/caregiver understands and will follow disposition?: No, wishes to speak with PCP  Copied from CRM #[id number]. Topic: Clinical - Red Word Triage >> Oct 21, 2024  2:41 PM Alfonso HERO wrote: Red Word that prompted transfer to Nurse Triage: blood in stool Reason for Disposition  [1] Rectal bleeding is minimal (e.g., blood just on toilet paper, few drops, streaks on surface of normal formed BM) AND [2] bleeding recurs 3 or more times after using Care Advice  Answer Assessment - Initial Assessment Questions Pt called in stating she has had bright red blood with every BM for the past 3 weeks. She was trying to use prep-H but has not been effective. Discussed blood is on toilet paper and occurs every time. Denies dark blood, darkening stool or GI discomfort. Pt is taking miralax and chlorophyl liquid daily to aid with softening stools. Pt did not want to schedule an appt but wanted a referral to GI in [place], [place]. Please advise.   1. APPEARANCE of BLOOD: What color is it? Is it passed separately, on the surface of the stool, or mixed in with the stool?      Bright red on toilet paper x 3 weeks   2. AMOUNT: How much blood was passed?      Not on stool but on toilet paper   3. FREQUENCY: How many times has blood been passed with the stools?      With every BM for past 3 weeks   4. ONSET: When was the blood first seen in the stools? (Days or weeks)      Not on stool  5. DIARRHEA: Is there also some diarrhea? If Yes, ask: How many diarrhea stools in the past 24 hours?      No; Bms are formed    6. CONSTIPATION: Do you have constipation? If Yes, ask: How bad is it?     Pt taking chlorophyl liquid and miralax; high fiber diet   7. RECURRENT SYMPTOMS: Have you had blood in your stools before? If Yes, ask: When was the last time? and What happened that time?      No  8. BLOOD THINNERS: Do you take any blood thinners? (e.g., aspirin, clopidogrel / Plavix, coumadin, heparin). Notes: Other strong blood thinners include: Arixtra (fondaparinux), Eliquis (apixaban), Pradaxa (dabigatran), and Xarelto (rivaroxaban).     No  9. OTHER SYMPTOMS: Do you have any other symptoms?  (e.g., abdomen pain, vomiting, dizziness, fever)     None  Protocols used: Rectal Bleeding-A-AH

## 2024-10-21 NOTE — Telephone Encounter (Signed)
 Spoke with patient, OV scheduled to discuss

## 2024-11-02 ENCOUNTER — Ambulatory Visit: Admitting: Internal Medicine

## 2024-11-02 VITALS — BP 130/80 | HR 75 | Temp 98.2°F | Ht 65.0 in | Wt 161.6 lb

## 2024-11-02 DIAGNOSIS — I10 Essential (primary) hypertension: Secondary | ICD-10-CM

## 2024-11-02 DIAGNOSIS — K648 Other hemorrhoids: Secondary | ICD-10-CM | POA: Diagnosis not present

## 2024-11-02 LAB — BASIC METABOLIC PANEL WITH GFR
BUN: 14 mg/dL (ref 6–23)
CO2: 31 meq/L (ref 19–32)
Calcium: 10.1 mg/dL (ref 8.4–10.5)
Chloride: 102 meq/L (ref 96–112)
Creatinine, Ser: 0.87 mg/dL (ref 0.40–1.20)
GFR: 71.27 mL/min (ref >60.00)
Glucose, Bld: 105 mg/dL — ABNORMAL HIGH (ref 70–99)
Potassium: 3.9 meq/L (ref 3.5–5.1)
Sodium: 140 meq/L (ref 135–145)

## 2024-11-02 LAB — CBC WITH DIFFERENTIAL/PLATELET
Basophils Absolute: 0.1 K/uL (ref 0.0–0.1)
Basophils Relative: 1.1 % (ref 0.0–3.0)
Eosinophils Absolute: 0.2 K/uL (ref 0.0–0.7)
Eosinophils Relative: 3.7 % (ref 0.0–5.0)
HCT: 41.7 % (ref 36.0–46.0)
Hemoglobin: 14 g/dL (ref 12.0–15.0)
Lymphocytes Relative: 42 % (ref 12.0–46.0)
Lymphs Abs: 2.4 K/uL (ref 0.7–4.0)
MCHC: 33.5 g/dL (ref 30.0–36.0)
MCV: 84.3 fl (ref 78.0–100.0)
Monocytes Absolute: 0.4 K/uL (ref 0.1–1.0)
Monocytes Relative: 7.1 % (ref 3.0–12.0)
Neutro Abs: 2.6 K/uL (ref 1.4–7.7)
Neutrophils Relative %: 46.1 % (ref 43.0–77.0)
Platelets: 305 K/uL (ref 150.0–400.0)
RBC: 4.95 Mil/uL (ref 3.87–5.11)
RDW: 14.5 % (ref 11.5–15.5)
WBC: 5.6 K/uL (ref 4.0–10.5)

## 2024-11-02 NOTE — Progress Notes (Unsigned)
 Subjective:  Patient ID: Gabrielle Long, female    DOB: 07-04-1961  Age: 64 y.o. MRN: [id number]  CC: Rectal Bleeding  (Patient states that when she has a BM she sees blood either in the toilet or when she wipes its bright red. It has been going on for a little more than a month. Patient states that she has a BM almost daily. )   HPI Gabrielle Long presents for f/up ---  Discussed the use of AI scribe software for clinical note transcription with the patient, who gave verbal consent to proceed.  History of Present Illness Gabrielle Long is a 63 year old female who presents with bright red blood in her stool for over a month.  She has been experiencing bright red blood in her stool for a little over a month. The bleeding is not associated with any pain and is sometimes seen on the toilet paper, mixed in the stool, or in the toilet bowl. The amount of blood varies, with some days having more than others.  She experiences occasional constipation, although she generally has a bowel movement almost daily, with sometimes a day in between. She uses Miralax, which she adds to a drink in the morning, to help with constipation, and it has been effective in aiding bowel movements. No abdominal pain or cramping is associated with the bloody bowel movements. Her last colonoscopy was three years ago.  She had a urinary tract infection a couple of weeks ago, which was treated with a seven-day course of medication. She reports that she is not experiencing any residual urinary symptoms after treatment for her urinary tract infection.  No family history of hemorrhoids, although her mother had fibroids. No abdominal pain or cramping. She feels well otherwise.     Outpatient Medications Prior to Visit  Medication Sig Dispense Refill   Cholecalciferol (VITAMIN D3) 5000 UNITS CAPS Take by mouth.       dapagliflozin  propanediol (FARXIGA ) 10 MG TABS tablet Take 1 tablet (10 mg total) by mouth daily before  breakfast. 90 tablet 1   fluticasone  (FLONASE ) 50 MCG/ACT nasal spray SHAKE LIQUID AND USE 2 SPRAYS IN EACH NOSTRIL DAILY 48 g 1   rosuvastatin  (CRESTOR ) 20 MG tablet Take 1 tablet (20 mg total) by mouth daily. 90 tablet 1   No facility-administered medications prior to visit.    ROS Review of Systems  Objective:  BP 130/80 (BP Location: Left Arm, Patient Position: Sitting, Cuff Size: Normal)   Pulse 75   Temp 98.2 F (36.8 C) (Oral)   Ht 5' 5 (1.651 m)   Wt 161 lb 9.6 oz (73.3 kg)   SpO2 98%   BMI 26.89 kg/m   BP Readings from Last 3 Encounters:  11/02/24 130/80  07/01/24 132/86  01/15/24 130/86    Wt Readings from Last 3 Encounters:  11/02/24 161 lb 9.6 oz (73.3 kg)  07/01/24 155 lb (70.3 kg)  01/15/24 163 lb 12.8 oz (74.3 kg)    Physical Exam Exam conducted with a chaperone present [place]).  Genitourinary:    Rectum: Guaiac result positive. Internal hemorrhoid present. No mass, tenderness, anal fissure or external hemorrhoid. Normal anal tone.     Lab Results  Component Value Date   WBC 4.5 07/01/2024   HGB 14.0 07/01/2024   HCT 44.4 07/01/2024   PLT 271 07/01/2024   GLUCOSE 86 07/01/2024   CHOL 123 07/01/2024   TRIG 51 07/01/2024   HDL 72 07/01/2024   LDLCALC 39 07/01/2024  ALT 41 (H) 07/01/2024   AST 27 07/01/2024   NA 140 07/01/2024   K 4.1 07/01/2024   CL 102 07/01/2024   CREATININE 0.83 07/01/2024   BUN 13 07/01/2024   CO2 22 07/01/2024   TSH 1.160 01/15/2024   HGBA1C 5.7 (H) 12/31/2017    MM DIAG BREAST TOMO UNI LEFT Result Date: 03/13/2022 CLINICAL DATA:  Callback from screening mammogram for possible asymmetry left breast EXAM: DIGITAL DIAGNOSTIC UNILATERAL LEFT MAMMOGRAM WITH TOMOSYNTHESIS AND CAD; ULTRASOUND LEFT BREAST LIMITED TECHNIQUE: Left digital diagnostic mammography and breast tomosynthesis was performed. The images were evaluated with computer-aided detection.; Targeted ultrasound examination of the left breast was performed.  COMPARISON:  Prior films ACR Breast Density Category c: The breast tissue is heterogeneously dense, which may obscure small masses. FINDINGS: Lateral view of left breast, spot compression left MLO view are submitted. Previously questioned asymmetry is persistent on additional views. Targeted ultrasound is performed, showing a normal intramammary lymph node at the left breast 2 o'clock 8 cm from nipple correlating to the mammographic asymmetry. IMPRESSION: Benign findings. RECOMMENDATION: Routine screening mammogram back on schedule. I have discussed the findings and recommendations with the patient. If applicable, a reminder letter will be sent to the patient regarding the next appointment. BI-RADS CATEGORY  2: Benign. Electronically Signed   By: Craig Farr M.D.   On: 03/13/2022 14:40   US  BREAST LTD UNI LEFT INC AXILLA Result Date: 03/13/2022 CLINICAL DATA:  Callback from screening mammogram for possible asymmetry left breast EXAM: DIGITAL DIAGNOSTIC UNILATERAL LEFT MAMMOGRAM WITH TOMOSYNTHESIS AND CAD; ULTRASOUND LEFT BREAST LIMITED TECHNIQUE: Left digital diagnostic mammography and breast tomosynthesis was performed. The images were evaluated with computer-aided detection.; Targeted ultrasound examination of the left breast was performed. COMPARISON:  Prior films ACR Breast Density Category c: The breast tissue is heterogeneously dense, which may obscure small masses. FINDINGS: Lateral view of left breast, spot compression left MLO view are submitted. Previously questioned asymmetry is persistent on additional views. Targeted ultrasound is performed, showing a normal intramammary lymph node at the left breast 2 o'clock 8 cm from nipple correlating to the mammographic asymmetry. IMPRESSION: Benign findings. RECOMMENDATION: Routine screening mammogram back on schedule. I have discussed the findings and recommendations with the patient. If applicable, a reminder letter will be sent to the patient regarding the  next appointment. BI-RADS CATEGORY  2: Benign. Electronically Signed   By: Craig Farr M.D.   On: 03/13/2022 14:40   Assessment & Plan:  Essential hypertension -     CBC with Differential/Platelet; Future -     Basic metabolic panel with GFR; Future  Bleeding internal hemorrhoids -     Ambulatory referral to Gastroenterology     Follow-up: Return in about 3 months (around 01/31/2025).  Debby Molt, MD

## 2024-11-02 NOTE — Patient Instructions (Signed)
 Hemorrhoids Hemorrhoids are swollen veins in and around the rectum or the opening of the butt (anus). There are two types of hemorrhoids: Internal. These occur in the veins just inside the rectum. They may poke through to the outside and become irritated and painful. External. These occur in the veins outside the anus. They can be felt as a painful swelling or hard lump near the anus. Most hemorrhoids do not cause severe problems. Often, they can be treated at home with diet and lifestyle changes. If home treatments do not help, you may need a procedure to shrink or remove the hemorrhoids. What are the causes? Hemorrhoids are caused by pressure near the anus. This pressure may be caused by: Constipation or diarrhea. Straining to poop. Pregnancy. Obesity. Sitting or riding a bike for a long time. Heavy lifting or other things that cause you to strain. Anal sex. What are the signs or symptoms? Symptoms of this condition include: Pain. Anal itching or irritation. Bleeding from the rectum. Leakage of poop (stool). Swelling of the anus. One or more lumps around the anus. How is this diagnosed? Hemorrhoids can often be diagnosed through a visual exam. Other exams or tests may also be done, such as: A digital rectal exam. This is when your health care provider feels inside your rectum with a gloved finger. Anoscope. This is an exam of the anus using a small tube. A blood test, if you have lost a lot of blood. A sigmoidoscopy or colonoscopy. These are tests to look inside the colon using a tube with a camera on the end. How is this treated? In most cases, hemorrhoids can be treated at home with diet and lifestyle changes. If these changes do not help, you may need to have a procedure done. These procedures can make the hemorrhoids smaller or fully remove them. Common procedures include: Rubber band ligation. Rubber bands are placed at the base of the hemorrhoids to cut off their blood  supply. Sclerotherapy. Medicine is put into the hemorrhoids to shrink them. Infrared coagulation. A type of light energy is used to get rid of the hemorrhoids. Hemorrhoidectomy surgery. The hemorrhoids are removed during surgery. Then, the veins that supply them are tied off. Stapled hemorrhoidopexy surgery. The base of the hemorrhoid is stapled to the wall of the rectum. Follow these instructions at home: Medicines Take over-the-counter and prescription medicines only as told by your provider. Use medicated creams or medicines that are put in the rectum (suppositories) as told by your provider. Eating and drinking  Eat foods that are high in fiber, such as beans, whole grains, and fresh fruits and vegetables. Ask your provider about taking products that have fiber added to them (fiber supplements). Reduce the amount of fat in your diet. You can do this by eating low-fat dairy products, eating less red meat, and avoiding processed foods. Drink enough fluid to keep your pee (urine) pale yellow. Managing pain and swelling  Take warm sitz baths for 20 minutes, 3-4 times a day. This can help ease pain and discomfort. You may do this in a bathtub or you can use a portable sitz bath that fits over the toilet. If told, put ice on the affected area. It may help to use ice packs between sitz baths. Put ice in a plastic bag. Place a towel between your skin and the bag. Leave the ice on for 20 minutes, 2-3 times a day. If your skin turns bright red, remove the ice right away to prevent  skin damage. The risk of damage is higher if you cannot feel pain, heat, or cold. General instructions Exercise. Ask your provider how much and what kind of exercise is best for you. In general, you should do moderate exercise for at least 30 minutes on most days of the week (150 minutes each week). You may want to try walking, biking, or yoga. Go to the bathroom when you have the urge to poop. Do not wait. Avoid  straining to poop. Keep the anus dry and clean. Use wet toilet paper or moist towelettes after you poop. Do not sit on the toilet for a long time. This can increase blood pooling and pain. Where to find more information [employer] and Digestive [employer] Kidney Diseases: [employer] Contact a health care provider if: You have more pain and swelling that do not get better with treatment. You have trouble pooping or you are not able to poop. You have pain or inflammation outside the area of the hemorrhoids. Get help right away if: You are bleeding from your rectum and you cannot get it to stop. This information is not intended to replace advice given to you by your health care provider. Make sure you discuss any questions you have with your health care provider. Document Revised: 07/18/2022 Document Reviewed: 07/18/2022 Elsevier Patient Education  2024 [employer].

## 2024-11-03 ENCOUNTER — Ambulatory Visit: Payer: Self-pay | Admitting: Internal Medicine

## 2025-01-04 ENCOUNTER — Ambulatory Visit: Admitting: Internal Medicine
